# Patient Record
Sex: Female | Born: 1965
Health system: Southern US, Community
[De-identification: ages and names within clinical notes are randomized; demographics above are authoritative.]

## PROBLEM LIST (undated history)

## (undated) DIAGNOSIS — I10 Essential (primary) hypertension: Secondary | ICD-10-CM

## (undated) DIAGNOSIS — T7840XA Allergy, unspecified, initial encounter: Secondary | ICD-10-CM

## (undated) DIAGNOSIS — F419 Anxiety disorder, unspecified: Secondary | ICD-10-CM

## (undated) DIAGNOSIS — K5790 Diverticulosis of intestine, part unspecified, without perforation or abscess without bleeding: Secondary | ICD-10-CM

## (undated) DIAGNOSIS — E079 Disorder of thyroid, unspecified: Secondary | ICD-10-CM

## (undated) DIAGNOSIS — K219 Gastro-esophageal reflux disease without esophagitis: Secondary | ICD-10-CM

## (undated) DIAGNOSIS — F32A Depression, unspecified: Secondary | ICD-10-CM

## (undated) DIAGNOSIS — F329 Major depressive disorder, single episode, unspecified: Secondary | ICD-10-CM

## (undated) HISTORY — DX: Gastro-esophageal reflux disease without esophagitis: K21.9

## (undated) HISTORY — DX: Disorder of thyroid, unspecified: E07.9

## (undated) HISTORY — PX: ARTHROSCOPIC REPAIR ACL: SUR80

## (undated) HISTORY — DX: Essential (primary) hypertension: I10

## (undated) HISTORY — DX: Depression, unspecified: F32.A

## (undated) HISTORY — PX: WISDOM TOOTH EXTRACTION: SHX21

## (undated) HISTORY — DX: Allergy, unspecified, initial encounter: T78.40XA

## (undated) HISTORY — DX: Anxiety disorder, unspecified: F41.9

## (undated) HISTORY — DX: Diverticulosis of intestine, part unspecified, without perforation or abscess without bleeding: K57.90

## (undated) HISTORY — DX: Major depressive disorder, single episode, unspecified: F32.9

---

## 2012-03-17 DIAGNOSIS — G4700 Insomnia, unspecified: Secondary | ICD-10-CM | POA: Insufficient documentation

## 2013-01-01 DIAGNOSIS — R7301 Impaired fasting glucose: Secondary | ICD-10-CM | POA: Insufficient documentation

## 2015-05-11 DIAGNOSIS — E785 Hyperlipidemia, unspecified: Secondary | ICD-10-CM | POA: Insufficient documentation

## 2016-05-10 DIAGNOSIS — D1720 Benign lipomatous neoplasm of skin and subcutaneous tissue of unspecified limb: Secondary | ICD-10-CM | POA: Insufficient documentation

## 2018-01-09 DIAGNOSIS — K760 Fatty (change of) liver, not elsewhere classified: Secondary | ICD-10-CM | POA: Insufficient documentation

## 2018-07-23 MED FILL — FLUTICASONE PROP 50 MCG SPR: 50 | 60 days supply | Qty: 16 | Fill #0

## 2018-07-23 MED FILL — ATORVASTATIN CALCIUM 20 MG: 20 | 90 days supply | Qty: 90 | Fill #0

## 2018-07-23 MED FILL — BISOPROLOL-HCTZ 2.5-6.25 MG: 2.5-6.25 | 90 days supply | Qty: 90 | Fill #0

## 2018-07-23 MED FILL — LIOTHYRONINE SODIUM 5 MCG T: 5 | 90 days supply | Qty: 90 | Fill #0

## 2018-07-23 MED FILL — FLUoxetine HCL 20 MG CAPS: 20 | 90 days supply | Qty: 270 | Fill #0

## 2018-11-06 MED FILL — FLUoxetine HCL 20 MG CAPS: 20 | 30 days supply | Qty: 90 | Fill #0

## 2018-11-11 ENCOUNTER — Ambulatory Visit (INDEPENDENT_AMBULATORY_CARE_PROVIDER_SITE_OTHER): Payer: 59 | Admitting: Family Medicine

## 2018-11-11 ENCOUNTER — Encounter: Payer: Self-pay | Admitting: Family Medicine

## 2018-11-11 VITALS — BP 126/79 | HR 64 | Temp 98.8°F | Ht 61.0 in | Wt 194.2 lb

## 2018-11-11 DIAGNOSIS — E039 Hypothyroidism, unspecified: Secondary | ICD-10-CM | POA: Diagnosis not present

## 2018-11-11 DIAGNOSIS — F419 Anxiety disorder, unspecified: Secondary | ICD-10-CM | POA: Diagnosis not present

## 2018-11-11 DIAGNOSIS — Z23 Encounter for immunization: Secondary | ICD-10-CM | POA: Diagnosis not present

## 2018-11-11 DIAGNOSIS — H9190 Unspecified hearing loss, unspecified ear: Secondary | ICD-10-CM

## 2018-11-11 DIAGNOSIS — G4733 Obstructive sleep apnea (adult) (pediatric): Secondary | ICD-10-CM

## 2018-11-11 DIAGNOSIS — K219 Gastro-esophageal reflux disease without esophagitis: Secondary | ICD-10-CM

## 2018-11-11 DIAGNOSIS — F325 Major depressive disorder, single episode, in full remission: Secondary | ICD-10-CM | POA: Diagnosis not present

## 2018-11-11 DIAGNOSIS — E785 Hyperlipidemia, unspecified: Secondary | ICD-10-CM | POA: Diagnosis not present

## 2018-11-11 DIAGNOSIS — J309 Allergic rhinitis, unspecified: Secondary | ICD-10-CM | POA: Diagnosis not present

## 2018-11-11 DIAGNOSIS — Z6836 Body mass index (BMI) 36.0-36.9, adult: Secondary | ICD-10-CM

## 2018-11-11 DIAGNOSIS — I10 Essential (primary) hypertension: Secondary | ICD-10-CM | POA: Diagnosis not present

## 2018-11-11 MED ORDER — ATORVASTATIN CALCIUM 20 MG PO TABS: 20.0000 mg | ORAL_TABLET | Freq: Every day | ORAL | 3 refills | Status: DC

## 2018-11-11 MED ORDER — FLUTICASONE PROPIONATE 50 MCG/ACT NA SUSP: 2.0000 | Freq: Every day | NASAL | 3 refills | Status: DC

## 2018-11-11 MED ORDER — FLUOXETINE HCL 20 MG PO CAPS: 20.0000 mg | ORAL_CAPSULE | Freq: Every day | ORAL | 3 refills | Status: DC

## 2018-11-11 MED ORDER — LIOTHYRONINE SODIUM 5 MCG PO TABS: 5.0000 ug | ORAL_TABLET | Freq: Every day | ORAL | 3 refills | Status: DC

## 2018-11-11 MED ORDER — OMEPRAZOLE 40 MG PO CPDR: 40.0000 mg | DELAYED_RELEASE_CAPSULE | Freq: Every day | ORAL | 3 refills | Status: DC

## 2018-11-11 MED FILL — OMEPRAZOLE 40 MG CPDR: 40 | 90 days supply | Qty: 90 | Fill #0

## 2018-11-11 MED FILL — LIOTHYRONINE SODIUM 5 MCG T: 5 | 90 days supply | Qty: 90 | Fill #0

## 2018-11-11 MED FILL — ATORVASTATIN CALCIUM 20 MG: 20 | 90 days supply | Qty: 90 | Fill #0

## 2018-11-11 MED FILL — FLUTICASONE PROP 50 MCG SPR: 50 | 30 days supply | Qty: 16 | Fill #0

## 2018-11-11 NOTE — Assessment & Plan Note (Signed)
Continue liothyronine 5 mcg daily.  Obtain records from previous PCP regarding most recent thyroid labs.

## 2018-11-11 NOTE — Progress Notes (Signed)
Chief Complaint:  Rachael Green is a 53 y.o. female who presents today with a chief complaint of fatigue and to establish care.   Assessment/Plan:  Fatigue Likely multifactorial.  It is possible that she has underlying untreated sleep apnea.  We will stop her beta-blocker today which will hopefully help some.  Will obtain records from previous PCP regarding most recent blood work.  Will need to have repeat CBC, CMAC, and B12 if not done recently.  If above is negative and continues to have issues, will readdress possible underlying sleep apnea.  Essential hypertension Well-controlled.  We will stop her current medication given that she is typically well controlled and her beta-blocker could potentially be contributing to her fatigue.  Discussed close home blood pressure monitoring with goal 140/90 or lower.  Discussed importance of regular activity and low-salt diet.  Follow-up with me in 6 months.  Hypothyroidism Continue liothyronine 5 mcg daily.  Obtain records from previous PCP regarding most recent thyroid labs.  Depression, major, in remission ([name]) Stable.  Continue Prozac 20 mg daily.  Anxiety Stable.  Continue Prozac 20 mg daily.  GERD (gastroesophageal reflux disease) Stable.  Continue Prilosec 40 mg daily as needed.  Check B12 with next blood draw.  Dyslipidemia Continue atorvastatin 20 mg daily.  Obtain records from previous PCP regarding most recent lipid panel.  Allergic rhinitis Stable.  Continue Flonase.  Hearing loss No red flags.  Likely presbycusis.  Discussed possible referral to ENT/audiology however patient deferred for the time being.  Discussed importance of hearing protection.  OSA (obstructive sleep apnea) Diagnosed in [place].  Not on CPAP.  Preventative Healthcare Patient was instructed to return soon for CPE.  Flu shot given today.  Obtain records from recent PCP regarding most recent mammogram, colonoscopy, and Pap smear. Health Maintenance Due    Topic Date Due  . HIV Screening  09[birth year]1983  . INFLUENZA VACCINE  04/30/2018  . MAMMOGRAM  09/28/2018  . COLONOSCOPY  09/28/2018     Subjective:  HPI:  Fatigue She has had issues with fatigue for several years.  Symptoms are worsened over the last few weeks.  She has had a sleep study in the past and was reportedly diagnosed with OSA.  Patient never started CPAP therapy.  She has no clear precipitating event for worsening fatigue the last few weeks.  She has been more active.  She feels okay in the morning however becomes severely tired early in the afternoon.  Hearing loss Patient has also noticed worsening hearing over the last several years.  Noticed that some sounds become more muffled.  Has not seen a physician for this.  No vertigo.  No weakness or numbness.  Her chronic medical conditions are outlined below:  # Essential Hypertension - On bisoprolol 2.5-6.25 once daily. Tolerating well.  - ROS: No reported chest pain or shortness of breath  # Hypothyroidism - On liothyronine 3mcg daily and tolerating well. - ROS: No reported hot or cold interolance.   # Anxiety / Depression - On prozac 20mg  daily and doing well - ROS: No SI or HI  # GERD  - On prilosec 40mg  daily as needed and tolerating well  # Dyslipidemia - On lipitor 20mg  daily and tolerating well  # Allergic Rhinitis - Uses flonase as needed and doing well  Depression screen [facility] 2/9 11/11/2018  Decreased Interest 0  Down, Depressed, Hopeless 0  PHQ - 2 Score 0  Altered sleeping 3  Tired, decreased energy 2  Change  in appetite 0  Feeling bad or failure about yourself  0  Trouble concentrating 0  Moving slowly or fidgety/restless 0  Suicidal thoughts 0  PHQ-9 Score 5  Difficult doing work/chores Not difficult at all    GAD 7 : Generalized Anxiety Score 11/11/2018  Nervous, Anxious, on Edge 2  Control/stop worrying 0  Worry too much - different things 2  Trouble relaxing 2  Restless 2  Easily  annoyed or irritable 0  Afraid - awful might happen 0  Total GAD 7 Score 8  Anxiety Difficulty Not difficult at all   ROS: Per HPI, otherwise a complete review of systems was negative.   PMH:  The following were reviewed and entered/updated in epic: Past Medical History:  Diagnosis Date  . Allergy   . Anxiety   . Depression   . Diverticulosis   . GERD (gastroesophageal reflux disease)   . Hypertension   . Thyroid disease    Patient Active Problem List   Diagnosis Date Noted  . Essential hypertension 11/11/2018  . Hypothyroidism 11/11/2018  . Depression, major, in remission ([place]) 11/11/2018  . Anxiety 11/11/2018  . GERD (gastroesophageal reflux disease) 11/11/2018  . Dyslipidemia 11/11/2018  . Allergic rhinitis 11/11/2018  . Hearing loss 11/11/2018  . OSA (obstructive sleep apnea) 11/11/2018   Past Surgical History:  Procedure Laterality Date  . ARTHROSCOPIC REPAIR ACL     left  . WISDOM TOOTH EXTRACTION      Family History  Problem Relation Age of Onset  . Depression Mother   . Kidney cancer Mother   . Leukemia Maternal Uncle     Medications- reviewed and updated Current Outpatient Medications  Medication Sig Dispense Refill  . atorvastatin (LIPITOR) 20 MG tablet Take 1 tablet (20 mg total) by mouth daily at 6 PM. 90 tablet 3  . FLUoxetine (PROZAC) 20 MG capsule Take 1 capsule (20 mg total) by mouth daily. 90 capsule 3  . fluticasone (FLONASE) 50 MCG/ACT nasal spray Place 2 sprays into both nostrils daily. 16 g 3  . liothyronine (CYTOMEL) 5 MCG tablet Take 1 tablet (5 mcg total) by mouth daily. 90 tablet 3  . omeprazole (PRILOSEC) 40 MG capsule Take 1 capsule (40 mg total) by mouth daily. 90 capsule 3   No current facility-administered medications for this visit.     Allergies-reviewed and updated No Known Allergies  Social History   Socioeconomic History  . Marital status: Married    Spouse name: Not on file  . Number of children: Not on file  .  Years of education: Not on file  . Highest education level: Not on file  Occupational History  . Not on file  Social Needs  . Financial resource strain: Not on file  . Food insecurity:    Worry: Not on file    Inability: Not on file  . Transportation needs:    Medical: Not on file    Non-medical: Not on file  Tobacco Use  . Smoking status: Never Smoker  . Smokeless tobacco: Never Used  Substance and Sexual Activity  . Alcohol use: Yes    Alcohol/week: 2.0 standard drinks    Types: 2 Glasses of wine per week  . Drug use: Never  . Sexual activity: Yes  Lifestyle  . Physical activity:    Days per week: Not on file    Minutes per session: Not on file  . Stress: Not on file  Relationships  . Social connections:    Talks  on phone: Not on file    Gets together: Not on file    Attends religious service: Not on file    Active member of club or organization: Not on file    Attends meetings of clubs or organizations: Not on file    Relationship status: Not on file  Other Topics Concern  . Not on file  Social History Narrative  . Not on file         Objective:  Physical Exam: BP 126/79 (BP Location: Left Arm, Patient Position: Sitting, Cuff Size: Normal)   Pulse 64   Temp 98.8 F (37.1 C) (Oral)   Ht 5\' 1"  (1.549 m)   Wt 194 lb 3.2 oz (88.1 kg)   LMP 07/11/2018   BMI 36.69 kg/m   Gen: NAD, resting comfortably HEENT: EAC and TMs normal bilaterally. CV: Regular rate and rhythm with no murmurs appreciated Pulm: Normal work of breathing, clear to auscultation bilaterally with no crackles, wheezes, or rhonchi GI: Normal bowel sounds present. Soft, Nontender, Nondistended. MSK: No edema, cyanosis, or clubbing noted Skin: Warm, dry Neuro: Grossly normal, moves all extremities Psych: Normal affect and thought content     Caleb M. Jerline Pain, MD 11/11/2018 12:00 PM

## 2018-11-11 NOTE — Assessment & Plan Note (Signed)
Continue atorvastatin 20 mg daily.  Obtain records from previous PCP regarding most recent lipid panel.

## 2018-11-11 NOTE — Assessment & Plan Note (Signed)
Stable.  Continue Prozac 20 mg daily. 

## 2018-11-11 NOTE — Assessment & Plan Note (Signed)
No red flags.  Likely presbycusis.  Discussed possible referral to ENT/audiology however patient deferred for the time being.  Discussed importance of hearing protection.

## 2018-11-11 NOTE — Assessment & Plan Note (Signed)
Stable. Continue Flonase 

## 2018-11-11 NOTE — Assessment & Plan Note (Signed)
Diagnosed in [place].  Not on CPAP.

## 2018-11-11 NOTE — Assessment & Plan Note (Signed)
Well-controlled.  We will stop her current medication given that she is typically well controlled and her beta-blocker could potentially be contributing to her fatigue.  Discussed close home blood pressure monitoring with goal 140/90 or lower.  Discussed importance of regular activity and low-salt diet.  Follow-up with me in 6 months.

## 2018-11-11 NOTE — Assessment & Plan Note (Signed)
Stable.  Continue Prilosec 40 mg daily as needed.  Check B12 with next blood draw.

## 2018-11-11 NOTE — Patient Instructions (Addendum)
It was very nice to see you today!  Please stop your blood pressure medications.  Please keep an eye on your blood pressure and let me know if persistently 140/90 or higher.  Please let me know if your fatigue levels do not improve.  I will refill your other medications today.   Come back to see me in 6 months for your physical with blood work, or sooner as needed.  Take care, Dr Jerline Pain

## 2018-12-01 ENCOUNTER — Other Ambulatory Visit: Payer: Self-pay | Admitting: Family Medicine

## 2018-12-01 MED ORDER — FLUOXETINE HCL 20 MG PO CAPS: 20.0000 mg | ORAL_CAPSULE | Freq: Every day | ORAL | 0 refills | Status: DC

## 2018-12-01 MED ORDER — ATORVASTATIN CALCIUM 20 MG PO TABS: 20.0000 mg | ORAL_TABLET | Freq: Every day | ORAL | 0 refills | Status: DC

## 2018-12-01 MED ORDER — LIOTHYRONINE SODIUM 5 MCG PO TABS: 5.0000 ug | ORAL_TABLET | Freq: Every day | ORAL | 0 refills | Status: DC

## 2018-12-01 NOTE — Telephone Encounter (Signed)
Copied from [place] [id number]. Topic: Quick Communication - Rx Refill/Question >> Dec 01, 2018  1:17 PM Waylan Rocher, Lumin L wrote: Medication: atorvastatin (LIPITOR) 20 MG tablet, FLUoxetine (PROZAC) 20 MG capsule, liothyronine (CYTOMEL) 5 MCG tablet   (out of scripts as of tomorrow, needs 8 days supply as [place])  Has the patient contacted their pharmacy? Yes.   (Agent: If no, request that the patient contact the pharmacy for the refill.) (Agent: If yes, when and what did the pharmacy advise?) she [place] of state   Preferred Pharmacy (with phone number or street name):  [id number] - [place], [address]  [place] [address]  Phone: [phone] Fax: [phone]   Agent: Please be advised that RX refills may take up to 3 business days. We ask that you follow-up with your pharmacy.  Patient would like a call once filled.

## 2018-12-01 NOTE — Telephone Encounter (Signed)
Requested Prescriptions  Pending Prescriptions Disp Refills  . atorvastatin (LIPITOR) 20 MG tablet 10 tablet 0    Sig: Take 1 tablet (20 mg total) by mouth daily at 6 PM.     Cardiovascular:  Antilipid - Statins Failed - 12/01/2018  1:58 PM      Failed - Total Cholesterol in normal range and within 360 days    No results found for: CHOL, POCCHOL       Failed - LDL in normal range and within 360 days    No results found for: LDLCALC, LDLC, HIRISKLDL       Failed - HDL in normal range and within 360 days    No results found for: HDL       Failed - Triglycerides in normal range and within 360 days    No results found for: TRIG       Passed - Patient is not pregnant      Passed - Valid encounter within last 12 months    Recent Outpatient Visits          2 weeks ago Need for immunization against influenza   [place] [facility], Algis Greenhouse, MD      Future Appointments            In 3 months Jerline Pain, Algis Greenhouse, MD [place], [place]         . liothyronine (CYTOMEL) 5 MCG tablet 10 tablet 0    Sig: Take 1 tablet (5 mcg total) by mouth daily.     Endocrinology:  Hypothyroid Agents Failed - 12/01/2018  1:58 PM      Failed - TSH needs to be rechecked within 3 months after an abnormal result. Refill until TSH is due.      Failed - TSH in normal range and within 360 days    No results found for: TSH       Passed - Valid encounter within last 12 months    Recent Outpatient Visits          2 weeks ago Need for immunization against influenza   [place] [facility], Algis Greenhouse, MD      Future Appointments            In 3 months Jerline Pain, Algis Greenhouse, MD [place], [facility]         . FLUoxetine (PROZAC) 20 MG capsule 10 capsule 0    Sig: Take 1 capsule (20 mg total) by mouth daily.     Psychiatry:  Antidepressants - SSRI Passed - 12/01/2018  1:58 PM      Passed - Completed PHQ-2 or PHQ-9 in the last 360  days.      Passed - Valid encounter within last 6 months    Recent Outpatient Visits          2 weeks ago Need for immunization against influenza   [place] [facility], Algis Greenhouse, MD      Future Appointments            In 3 months Jerline Pain, Algis Greenhouse, MD [place], [place]

## 2018-12-18 MED FILL — FLUoxetine HCL 20 MG CAPS: 20 | 90 days supply | Qty: 90 | Fill #0

## 2019-01-14 ENCOUNTER — Telehealth: Payer: Self-pay | Admitting: Family Medicine

## 2019-01-14 NOTE — Telephone Encounter (Signed)
See Rx refill request.  

## 2019-01-14 NOTE — Telephone Encounter (Signed)
Left voice message for patient to call clinic.  

## 2019-01-14 NOTE — Telephone Encounter (Signed)
Copied from [place] [id number]. Topic: Quick Communication - Rx Refill/Question >> Jan 14, 2019 10:20 AM Celene Kras A wrote: Medication: FLUoxetine (PROZAC) 20 MG capsule  Has the patient contacted their pharmacy? Yes.  Pt states she is almost out of this medication because she is used to taking 3 capsules a day, but pt states the pharmacy told her a refill was only sent in enough for 1 capsule a day. Please advise.   (Agent: If no, request that the patient contact the pharmacy for the refill.) (Agent: If yes, when and what did the pharmacy advise?)  Preferred Pharmacy (with phone number or street name): [id number] - [place], [place] [address] Phone: [phone] Fax: [phone] Not a 24 hour pharmacy; exact hours not known.    Agent: Please be advised that RX refills may take up to 3 business days. We ask that you follow-up with your pharmacy.

## 2019-01-14 NOTE — Telephone Encounter (Signed)
Ok to send in 60mg  daily.  Algis Greenhouse. Jerline Pain, MD 01/14/2019 12:15 PM

## 2019-01-14 NOTE — Telephone Encounter (Signed)
See note

## 2019-01-14 NOTE — Telephone Encounter (Signed)
Patient called and states that her prescription for FLUoxetine (PROZAC) 20 MG capsule is incorrect. She states she takes 3 tablets a day. She wants this resent into her pharmacy corrected since she is almost out. Please advise it seems patient has only had one appointment with Dr. Jerline Pain.

## 2019-01-15 ENCOUNTER — Other Ambulatory Visit: Payer: Self-pay

## 2019-01-15 MED ORDER — FLUOXETINE HCL 20 MG PO CAPS: 60.0000 mg | ORAL_CAPSULE | Freq: Every day | ORAL | 1 refills | Status: DC

## 2019-01-15 NOTE — Telephone Encounter (Signed)
done

## 2019-01-15 NOTE — Telephone Encounter (Signed)
Spoke with patient notified we will send Rx.

## 2019-01-22 MED FILL — FLUoxetine HCL 20 MG CAPS: 20 | 30 days supply | Qty: 90 | Fill #0

## 2019-03-08 MED FILL — ATORVASTATIN 20 MG TABLET: 20 | 90 days supply | Qty: 90 | Fill #1

## 2019-03-10 ENCOUNTER — Ambulatory Visit (INDEPENDENT_AMBULATORY_CARE_PROVIDER_SITE_OTHER): Payer: 59 | Admitting: Family Medicine

## 2019-03-10 ENCOUNTER — Other Ambulatory Visit: Payer: Self-pay

## 2019-03-10 ENCOUNTER — Encounter: Payer: Self-pay | Admitting: Family Medicine

## 2019-03-10 VITALS — BP 118/84 | HR 72 | Temp 98.9°F | Ht 61.0 in | Wt 195.2 lb

## 2019-03-10 DIAGNOSIS — F325 Major depressive disorder, single episode, in full remission: Secondary | ICD-10-CM | POA: Diagnosis not present

## 2019-03-10 DIAGNOSIS — I8393 Asymptomatic varicose veins of bilateral lower extremities: Secondary | ICD-10-CM

## 2019-03-10 DIAGNOSIS — K219 Gastro-esophageal reflux disease without esophagitis: Secondary | ICD-10-CM | POA: Diagnosis not present

## 2019-03-10 DIAGNOSIS — E785 Hyperlipidemia, unspecified: Secondary | ICD-10-CM

## 2019-03-10 DIAGNOSIS — Z6836 Body mass index (BMI) 36.0-36.9, adult: Secondary | ICD-10-CM | POA: Diagnosis not present

## 2019-03-10 DIAGNOSIS — J309 Allergic rhinitis, unspecified: Secondary | ICD-10-CM | POA: Diagnosis not present

## 2019-03-10 DIAGNOSIS — F419 Anxiety disorder, unspecified: Secondary | ICD-10-CM

## 2019-03-10 DIAGNOSIS — G4733 Obstructive sleep apnea (adult) (pediatric): Secondary | ICD-10-CM

## 2019-03-10 DIAGNOSIS — E039 Hypothyroidism, unspecified: Secondary | ICD-10-CM | POA: Diagnosis not present

## 2019-03-10 DIAGNOSIS — I10 Essential (primary) hypertension: Secondary | ICD-10-CM | POA: Diagnosis not present

## 2019-03-10 DIAGNOSIS — R5383 Other fatigue: Secondary | ICD-10-CM | POA: Diagnosis not present

## 2019-03-10 DIAGNOSIS — Z0001 Encounter for general adult medical examination with abnormal findings: Secondary | ICD-10-CM | POA: Diagnosis not present

## 2019-03-10 DIAGNOSIS — M25562 Pain in left knee: Secondary | ICD-10-CM

## 2019-03-10 LAB — COMPREHENSIVE METABOLIC PANEL
ALT: 56 U/L — ABNORMAL HIGH (ref 0–35)
AST: 40 U/L — ABNORMAL HIGH (ref 0–37)
Albumin: 4.3 g/dL (ref 3.5–5.2)
Alkaline Phosphatase: 95 U/L (ref 39–117)
BUN: 11 mg/dL (ref 6–23)
CO2: 30 mEq/L (ref 19–32)
Calcium: 9.4 mg/dL (ref 8.4–10.5)
Chloride: 102 mEq/L (ref 96–112)
Creatinine, Ser: 0.53 mg/dL (ref 0.40–1.20)
GFR: 121.89 mL/min (ref >60.00)
Glucose, Bld: 85 mg/dL (ref 70–99)
Potassium: 4.6 mEq/L (ref 3.5–5.1)
Sodium: 140 mEq/L (ref 135–145)
Total Bilirubin: 0.4 mg/dL (ref 0.2–1.2)
Total Protein: 6.7 g/dL (ref 6.0–8.3)

## 2019-03-10 LAB — CBC
HCT: 40.1 % (ref 36.0–46.0)
Hemoglobin: 13.8 g/dL (ref 12.0–15.0)
MCHC: 34.3 g/dL (ref 30.0–36.0)
MCV: 94.1 fl (ref 78.0–100.0)
Platelets: 291 10*3/uL (ref 150.0–400.0)
RBC: 4.26 Mil/uL (ref 3.87–5.11)
RDW: 13.2 % (ref 11.5–15.5)
WBC: 6.1 10*3/uL (ref 4.0–10.5)

## 2019-03-10 LAB — LIPID PANEL
Cholesterol: 164 mg/dL (ref 0–200)
HDL: 63.4 mg/dL (ref >39.00)
LDL Cholesterol: 76 mg/dL (ref 0–99)
NonHDL: 100.15
Total CHOL/HDL Ratio: 3
Triglycerides: 119 mg/dL (ref 0.0–149.0)
VLDL: 23.8 mg/dL (ref 0.0–40.0)

## 2019-03-10 LAB — VITAMIN B12: Vitamin B-12: 383 pg/mL (ref 211–911)

## 2019-03-10 LAB — HEMOGLOBIN A1C: Hgb A1c MFr Bld: 5.8 % (ref 4.6–6.5)

## 2019-03-10 LAB — VITAMIN D 25 HYDROXY (VIT D DEFICIENCY, FRACTURES): VITD: 33.48 ng/mL (ref 30.00–100.00)

## 2019-03-10 LAB — TSH: TSH: 1.97 u[IU]/mL (ref 0.35–4.50)

## 2019-03-10 MED ORDER — FLUTICASONE PROPIONATE 50 MCG/ACT NA SUSP: 2.0000 | Freq: Every day | NASAL | 3 refills | Status: DC

## 2019-03-10 MED ORDER — LIOTHYRONINE SODIUM 5 MCG PO TABS: 5.0000 ug | ORAL_TABLET | Freq: Every day | ORAL | 3 refills | Status: DC

## 2019-03-10 MED ORDER — ATORVASTATIN CALCIUM 20 MG PO TABS: 20.0000 mg | ORAL_TABLET | Freq: Every day | ORAL | 3 refills | Status: DC

## 2019-03-10 MED FILL — FLUTICASONE PROP 50 MCG SPR: 50 | 30 days supply | Qty: 16 | Fill #0

## 2019-03-10 MED FILL — LIOTHYRONINE SODIUM 5 MCG T: 5 | 90 days supply | Qty: 90 | Fill #0

## 2019-03-10 NOTE — Assessment & Plan Note (Signed)
Continue omeprazole as needed.

## 2019-03-10 NOTE — Progress Notes (Signed)
Chief Complaint:  Rachael Green is a 53 y.o. female who presents today for her annual comprehensive physical exam.    Assessment/Plan:  Hypothyroidism Continue current dose of Cytomel.  Check TSH.  GERD (gastroesophageal reflux disease) Continue omeprazole as needed.  Essential hypertension Stable off medications.  Dyslipidemia Check lipid panel.  Continue Lipitor 20 mg daily.  Depression, major, in remission ([place]) Stable off meds.  Continue with watchful waiting.  Anxiety Stable off meds.  Continue with watchful waiting.  Allergic rhinitis Stable.  Continue Flonase as needed seasonally.  OSA (obstructive sleep apnea) Could be contributing to her fatigue.  Will check labs as noted above.  May need referral for another sleep study if no significant findings.  Knee Pain Normal exam.  History consistent with possible Nischal tear.  Given symptoms are not prickly bothersome at this point, will continue with conservative management including compression, ice, and anti-inflammatories as needed.  If no improvement or symptoms worsen will need referral to sports medicine or orthopedics.  Varicose veins No red flags.  Reassured patient.  Fatigue / Insomnia Likely multifactorial.  She has sleep apnea that she is not currently being treated for.  This is likely the main driver.  We will also check CBC, C met, A1c, TSH, B12, and vitamin D.  Consider trial of sleep aid if above work-up is negative.  BMI 36 Discussed lifestyle modifications.   Preventative Healthcare: Advised patient to get mammogram soon.  Otherwise up-to-date on Pap and colon cancer screening.   Patient Counseling(The following topics were reviewed and/or handout was given):  -Nutrition: Stressed importance of moderation in sodium/caffeine intake, saturated fat and cholesterol, caloric balance, sufficient intake of fresh fruits, vegetables, and fiber.  -Stressed the importance of regular exercise.   -Substance  Abuse: Discussed cessation/primary prevention of tobacco, alcohol, or other drug use; driving or other dangerous activities under the influence; availability of treatment for abuse.   -Injury prevention: Discussed safety belts, safety helmets, smoke detector, smoking near bedding or upholstery.   -Sexuality: Discussed sexually transmitted diseases, partner selection, use of condoms, avoidance of unintended pregnancy and contraceptive alternatives.   -Dental health: Discussed importance of regular tooth brushing, flossing, and dental visits.  -Health maintenance and immunizations reviewed. Please refer to Health maintenance section.  Return to care in 1 year for next preventative visit.     Subjective:  HPI:  She has no acute complaints today.   She has had some difficulty with fatigue over the past several months.  Symptoms seem to be stable.  She has been tested for sleep apnea and was recommended to have a CPAP machine however she did not want to wear this.  Sleeps about 6 hours per night.  Does not think that she snores.  Frequently naps during the day.  She also has had some left knee pain that started about 2 months ago.  Started after having her electronic bike fall onto her knee.  Occasionally has buckling.  Symptoms to be worse first thing in the morning and gradually improves throughout the day.  No other treatments tried.  Symptoms seem to be improving.  Her stable, chronic medical conditions are outlined below:  # Essential Hypertension - Not currently on any medications - ROS: No reported chest pain or shortness of breath  # Hypothyroidism - On liothyronine 74mg daily and tolerating well. - ROS: No reported hot or cold interolance.   # Anxiety / Depression - Not currently on any medications - ROS: No SI or  HI  # GERD  - On prilosec 40mg daily as needed and tolerating well  # Dyslipidemia - On lipitor 20mg daily and tolerating well  # Allergic Rhinitis - Uses flonase  as needed and doing well  Lifestyle Diet: No specific diets or eating plans. Tries to eat a healthy diet overall.  Exercise: Was going to the gym, but has not been able to lately due to COVID.   Depression screen PHQ 2/9 03/10/2019  Decreased Interest 0  Down, Depressed, Hopeless 0  PHQ - 2 Score 0  Altered sleeping 3  Tired, decreased energy 0  Change in appetite 0  Feeling bad or failure about yourself  0  Trouble concentrating 0  Moving slowly or fidgety/restless 0  Suicidal thoughts 0  PHQ-9 Score 3  Difficult doing work/chores -    Health Maintenance Due  Topic Date Due  . HIV Screening  09[birth year]29/1981    ROS: Per HPI, otherwise a complete review of systems was negative.   PMH:  The following were reviewed and entered/updated in epic: Past Medical History:  Diagnosis Date  . Allergy   . Anxiety   . Depression   . Diverticulosis   . GERD (gastroesophageal reflux disease)   . Hypertension   . Thyroid disease    Patient Active Problem List   Diagnosis Date Noted  . Essential hypertension 11/11/2018  . Hypothyroidism 11/11/2018  . Depression, major, in remission ([facility]) 11/11/2018  . Anxiety 11/11/2018  . GERD (gastroesophageal reflux disease) 11/11/2018  . Dyslipidemia 11/11/2018  . Allergic rhinitis 11/11/2018  . Hearing loss 11/11/2018  . OSA (obstructive sleep apnea) 11/11/2018   Past Surgical History:  Procedure Laterality Date  . ARTHROSCOPIC REPAIR ACL     left  . WISDOM TOOTH EXTRACTION     Family History  Problem Relation Age of Onset  . Depression Mother   . Kidney cancer Mother   . Leukemia Maternal Uncle     Medications- reviewed and updated Current Outpatient Medications  Medication Sig Dispense Refill  . atorvastatin (LIPITOR) 20 MG tablet Take 1 tablet (20 mg total) by mouth daily at 6 PM. 90 tablet 3  . fluticasone (FLONASE) 50 MCG/ACT nasal spray Place 2 sprays into both nostrils daily. 16 g 3  . liothyronine (CYTOMEL) 5 MCG tablet Take  1 tablet (5 mcg total) by mouth daily. 90 tablet 3  . omeprazole (PRILOSEC) 40 MG capsule Take 1 capsule (40 mg total) by mouth daily. 90 capsule 3   No current facility-administered medications for this visit.     Allergies-reviewed and updated No Known Allergies  Social History   Socioeconomic History  . Marital status: Married    Spouse name: Not on file  . Number of children: Not on file  . Years of education: Not on file  . Highest education level: Not on file  Occupational History  . Not on file  Social Needs  . Financial resource strain: Not on file  . Food insecurity:    Worry: Not on file    Inability: Not on file  . Transportation needs:    Medical: Not on file    Non-medical: Not on file  Tobacco Use  . Smoking status: Never Smoker  . Smokeless tobacco: Never Used  Substance and Sexual Activity  . Alcohol use: Yes    Alcohol/week: 2.0 standard drinks    Types: 2 Glasses of wine per week  . Drug use: Never  . Sexual activity: Yes  Lifestyle  .   Physical activity:    Days per week: Not on file    Minutes per session: Not on file  . Stress: Not on file  Relationships  . Social connections:    Talks on phone: Not on file    Gets together: Not on file    Attends religious service: Not on file    Active member of club or organization: Not on file    Attends meetings of clubs or organizations: Not on file    Relationship status: Not on file  Other Topics Concern  . Not on file  Social History Narrative  . Not on file        Objective:  Physical Exam: BP 118/84 (BP Location: Left Arm, Patient Position: Sitting, Cuff Size: Normal)   Pulse 72   Temp 98.9 F (37.2 C) (Oral)   Ht 5' 1" (1.549 m)   Wt 195 lb 4 oz (88.6 kg)   SpO2 98%   BMI 36.89 kg/m   Body mass index is 36.89 kg/m. Wt Readings from Last 3 Encounters:  03/10/19 195 lb 4 oz (88.6 kg)  11/11/18 194 lb 3.2 oz (88.1 kg)   Gen: NAD, resting comfortably HEENT: TMs normal bilaterally.  OP clear. No thyromegaly noted.  CV: RRR with no murmurs appreciated Pulm: NWOB, CTAB with no crackles, wheezes, or rhonchi Breast: No deformities, lumps, or masses.  Chaperone present for exam. GI: Normal bowel sounds present. Soft, Nontender, Nondistended. MSK: no edema, cyanosis, or clubbing noted.  Left knee mildly tender to patient along joint line.  Full range of motion.  Mild crepitus with active range of motion.  Bilateral lower extremity varicose veins noted. Skin: warm, dry Neuro: CN2-12 grossly intact. Strength 5/5 in upper and lower extremities. Reflexes symmetric and intact bilaterally.  Psych: Normal affect and thought content     Ula Couvillon M. Jerline Pain, MD 03/10/2019 10:28 AM

## 2019-03-10 NOTE — Patient Instructions (Addendum)
It was very nice to see you today!  Your physical exam today is normal.  Your breast exam is normal.  Please get your mammogram soon.  We can repeat your Pap smear in a few years.  Please continue the good work with your diet and exercise.  You can schedule an appoint with a nutritionist if you wish.  I think that your knee pain possibly could be due to a torn meniscus.  Please use compression and ice to the area.  Keep your legs elevated and exercise frequently-this will also help with your varicose veins.  We will check blood work today to see for any possible causes for her fatigue.  We may need to try a sleep aid medication depending on the results.   Eat at least 3 REAL meals and 1-2 snacks per day.  Aim for no more than 5 hours between eating.  Eat breakfast within one hour of getting up.    Obtain twice as many fruits/vegetables as protein or carbohydrate foods for both lunch and dinner.   Cut down on sweet beverages. This includes juice, soda, and sweet tea.    Exercise at least 150 minutes every week.   Take care, Dr Jerline Pain   Preventive Care 40-64 Years, Female Preventive care refers to lifestyle choices and visits with your health care provider that can promote health and wellness. What does preventive care include?   A yearly physical exam. This is also called an annual well check.  Dental exams once or twice a year.  Routine eye exams. Ask your health care provider how often you should have your eyes checked.  Personal lifestyle choices, including: ? Daily care of your teeth and gums. ? Regular physical activity. ? Eating a healthy diet. ? Avoiding tobacco and drug use. ? Limiting alcohol use. ? Practicing safe sex. ? Taking low-dose aspirin daily starting at age 58. ? Taking vitamin and mineral supplements as recommended by your health care provider. What happens during an annual well check? The services and screenings done by your health care provider  during your annual well check will depend on your age, overall health, lifestyle risk factors, and family history of disease. Counseling Your health care provider may ask you questions about your:  Alcohol use.  Tobacco use.  Drug use.  Emotional well-being.  Home and relationship well-being.  Sexual activity.  Eating habits.  Work and work Statistician.  Method of birth control.  Menstrual cycle.  Pregnancy history. Screening You may have the following tests or measurements:  Height, weight, and BMI.  Blood pressure.  Lipid and cholesterol levels. These may be checked every 5 years, or more frequently if you are over 94 years old.  Skin check.  Lung cancer screening. You may have this screening every year starting at age 79 if you have a 30-pack-year history of smoking and currently smoke or have quit within the past 15 years.  Colorectal cancer screening. All adults should have this screening starting at age 49 and continuing until age 46. Your health care provider may recommend screening at age 27. You will have tests every 1-10 years, depending on your results and the type of screening test. People at increased risk should start screening at an earlier age. Screening tests may include: ? Guaiac-based fecal occult blood testing. ? Fecal immunochemical test (FIT). ? Stool DNA test. ? Virtual colonoscopy. ? Sigmoidoscopy. During this test, a flexible tube with a tiny camera (sigmoidoscope) is used to examine your rectum  and lower colon. The sigmoidoscope is inserted through your anus into your rectum and lower colon. ? Colonoscopy. During this test, a long, thin, flexible tube with a tiny camera (colonoscope) is used to examine your entire colon and rectum.  Hepatitis C blood test.  Hepatitis B blood test.  Sexually transmitted disease (STD) testing.  Diabetes screening. This is done by checking your blood sugar (glucose) after you have not eaten for a while  (fasting). You may have this done every 1-3 years.  Mammogram. This may be done every 1-2 years. Talk to your health care provider about when you should start having regular mammograms. This may depend on whether you have a family history of breast cancer.  BRCA-related cancer screening. This may be done if you have a family history of breast, ovarian, tubal, or peritoneal cancers.  Pelvic exam and Pap test. This may be done every 3 years starting at age 72. Starting at age 75, this may be done every 5 years if you have a Pap test in combination with an HPV test.  Bone density scan. This is done to screen for osteoporosis. You may have this scan if you are at high risk for osteoporosis. Discuss your test results, treatment options, and if necessary, the need for more tests with your health care provider. Vaccines Your health care provider may recommend certain vaccines, such as:  Influenza vaccine. This is recommended every year.  Tetanus, diphtheria, and acellular pertussis (Tdap, Td) vaccine. You may need a Td booster every 10 years.  Varicella vaccine. You may need this if you have not been vaccinated.  Zoster vaccine. You may need this after age 30.  Measles, mumps, and rubella (MMR) vaccine. You may need at least one dose of MMR if you were born in 1957 or later. You may also need a second dose.  Pneumococcal 13-valent conjugate (PCV13) vaccine. You may need this if you have certain conditions and were not previously vaccinated.  Pneumococcal polysaccharide (PPSV23) vaccine. You may need one or two doses if you smoke cigarettes or if you have certain conditions.  Meningococcal vaccine. You may need this if you have certain conditions.  Hepatitis A vaccine. You may need this if you have certain conditions or if you travel or work in places where you may be exposed to hepatitis A.  Hepatitis B vaccine. You may need this if you have certain conditions or if you travel or work in  places where you may be exposed to hepatitis B.  Haemophilus influenzae type b (Hib) vaccine. You may need this if you have certain conditions. Talk to your health care provider about which screenings and vaccines you need and how often you need them. This information is not intended to replace advice given to you by your health care provider. Make sure you discuss any questions you have with your health care provider. Document Released: 10/13/2015 Document Revised: 11/06/2017 Document Reviewed: 07/18/2015 Elsevier Interactive Patient Education  2019 [employer].

## 2019-03-10 NOTE — Assessment & Plan Note (Signed)
Could be contributing to her fatigue.  Will check labs as noted above.  May need referral for another sleep study if no significant findings.

## 2019-03-10 NOTE — Assessment & Plan Note (Signed)
Continue current dose of Cytomel.  Check TSH.

## 2019-03-10 NOTE — Assessment & Plan Note (Signed)
Stable.  Continue Flonase as needed seasonally.

## 2019-03-10 NOTE — Assessment & Plan Note (Signed)
Stable off medications.  

## 2019-03-10 NOTE — Assessment & Plan Note (Signed)
Check lipid panel.  Continue Lipitor 20 mg daily. 

## 2019-03-10 NOTE — Assessment & Plan Note (Signed)
Stable off meds.  Continue with watchful waiting.

## 2019-03-11 ENCOUNTER — Encounter: Payer: Self-pay | Admitting: Family Medicine

## 2019-03-11 DIAGNOSIS — R739 Hyperglycemia, unspecified: Secondary | ICD-10-CM | POA: Insufficient documentation

## 2019-03-11 NOTE — Progress Notes (Signed)
Please inform patient of the following:  Her liver tests are up just a bit. Recommend that we repeat in 4-6 weeks to make sure they are stable/improving. Most commonly this is cause by being overweight, though certain infections and medications can also cause it.  Her blood sugar is borderline. Do not need to start medications but recommend continuing to work on diet and exercise and we can recheck in a year.  No obvious causes for her fatigue - all of her other labs are normal. Would like to make sure she is getting adequate sleep. We can try sleep aide medication as we discussed or can check for OSA again.  Algis Greenhouse. Jerline Pain, MD 03/11/2019 4:02 PM

## 2019-05-13 ENCOUNTER — Encounter: Payer: 59 | Admitting: Family Medicine

## 2019-07-22 ENCOUNTER — Encounter: Payer: Self-pay | Admitting: Family Medicine

## 2019-07-25 ENCOUNTER — Encounter: Payer: Self-pay | Admitting: Family Medicine

## 2019-07-27 ENCOUNTER — Ambulatory Visit (INDEPENDENT_AMBULATORY_CARE_PROVIDER_SITE_OTHER): Payer: 59

## 2019-07-27 ENCOUNTER — Encounter: Payer: Self-pay | Admitting: Family Medicine

## 2019-07-27 ENCOUNTER — Other Ambulatory Visit: Payer: Self-pay

## 2019-07-27 DIAGNOSIS — Z23 Encounter for immunization: Secondary | ICD-10-CM | POA: Diagnosis not present

## 2019-08-17 MED FILL — FLUTICASONE PROP 50 MCG SPR: 50 | 30 days supply | Qty: 16 | Fill #1

## 2019-08-31 DIAGNOSIS — Z0100 Encounter for examination of eyes and vision without abnormal findings: Secondary | ICD-10-CM | POA: Diagnosis not present

## 2019-11-01 ENCOUNTER — Encounter: Payer: Self-pay | Admitting: Family Medicine

## 2019-11-02 ENCOUNTER — Ambulatory Visit: Payer: No Typology Code available for payment source | Attending: Internal Medicine

## 2019-11-02 DIAGNOSIS — Z20822 Contact with and (suspected) exposure to covid-19: Secondary | ICD-10-CM | POA: Insufficient documentation

## 2019-11-02 MED FILL — FLUTICASONE PROP 50 MCG SPR: 50 | 30 days supply | Qty: 16 | Fill #2

## 2019-11-03 LAB — NOVEL CORONAVIRUS, NAA: SARS-CoV-2, NAA: NOT DETECTED

## 2019-11-04 ENCOUNTER — Ambulatory Visit (INDEPENDENT_AMBULATORY_CARE_PROVIDER_SITE_OTHER): Payer: No Typology Code available for payment source | Admitting: Family Medicine

## 2019-11-04 ENCOUNTER — Encounter: Payer: Self-pay | Admitting: Family Medicine

## 2019-11-04 ENCOUNTER — Other Ambulatory Visit (INDEPENDENT_AMBULATORY_CARE_PROVIDER_SITE_OTHER): Payer: No Typology Code available for payment source

## 2019-11-04 VITALS — BP 132/91 | HR 70

## 2019-11-04 DIAGNOSIS — R42 Dizziness and giddiness: Secondary | ICD-10-CM | POA: Diagnosis not present

## 2019-11-04 DIAGNOSIS — E039 Hypothyroidism, unspecified: Secondary | ICD-10-CM | POA: Diagnosis not present

## 2019-11-04 DIAGNOSIS — J309 Allergic rhinitis, unspecified: Secondary | ICD-10-CM

## 2019-11-04 LAB — CBC
HCT: 41.3 % (ref 36.0–46.0)
Hemoglobin: 14.1 g/dL (ref 12.0–15.0)
MCHC: 34.2 g/dL (ref 30.0–36.0)
MCV: 95.5 fl (ref 78.0–100.0)
Platelets: 297 10*3/uL (ref 150.0–400.0)
RBC: 4.32 Mil/uL (ref 3.87–5.11)
RDW: 13.1 % (ref 11.5–15.5)
WBC: 7.2 10*3/uL (ref 4.0–10.5)

## 2019-11-04 LAB — URINALYSIS, ROUTINE W REFLEX MICROSCOPIC
Bilirubin Urine: NEGATIVE
Hgb urine dipstick: NEGATIVE
Ketones, ur: NEGATIVE
Nitrite: NEGATIVE
RBC / HPF: NONE SEEN (ref 0–?)
Specific Gravity, Urine: 1.015 (ref 1.000–1.030)
Total Protein, Urine: NEGATIVE
Urine Glucose: NEGATIVE
Urobilinogen, UA: 0.2 (ref 0.0–1.0)
pH: 6 (ref 5.0–8.0)

## 2019-11-04 LAB — COMPREHENSIVE METABOLIC PANEL
ALT: 98 U/L — ABNORMAL HIGH (ref 0–35)
AST: 61 U/L — ABNORMAL HIGH (ref 0–37)
Albumin: 4.4 g/dL (ref 3.5–5.2)
Alkaline Phosphatase: 82 U/L (ref 39–117)
BUN: 12 mg/dL (ref 6–23)
CO2: 29 mEq/L (ref 19–32)
Calcium: 9.7 mg/dL (ref 8.4–10.5)
Chloride: 101 mEq/L (ref 96–112)
Creatinine, Ser: 0.59 mg/dL (ref 0.40–1.20)
GFR: 107.42 mL/min (ref >60.00)
Glucose, Bld: 121 mg/dL — ABNORMAL HIGH (ref 70–99)
Potassium: 4 mEq/L (ref 3.5–5.1)
Sodium: 138 mEq/L (ref 135–145)
Total Bilirubin: 0.7 mg/dL (ref 0.2–1.2)
Total Protein: 7.1 g/dL (ref 6.0–8.3)

## 2019-11-04 LAB — TSH: TSH: 3.69 u[IU]/mL (ref 0.35–4.50)

## 2019-11-04 MED ORDER — AZELASTINE HCL 0.1 % NA SOLN: 2.0000 | Freq: Two times a day (BID) | NASAL | 12 refills | Status: DC

## 2019-11-04 MED FILL — AZELASTINE HCL 137 MCG SPRY: 0.1 | 30 days supply | Qty: 30 | Fill #0

## 2019-11-04 NOTE — Assessment & Plan Note (Signed)
Unsure if this is contributing to her dizziness.  We will add on Astelin.  Continue Flonase 2 sprays daily.

## 2019-11-04 NOTE — Progress Notes (Signed)
   Rachael Green is a 54 y.o. female who presents today for a virtual office visit.  Assessment/Plan:  New/Acute Problems: Dizziness Broad differential.  No red flags.  Possibly dehydration.  Possibly sinusitis/eustachian tube dysfunction given her ear popping.  Discussed limitations of virtual visit and inability to perform physical exam.  She will come in for labs including CBC, C met, TSH, and urinalysis.  Will start Astelin to treat underlying allergic rhinitis.  Chronic Problems Addressed Today: Allergic rhinitis Unsure if this is contributing to her dizziness.  We will add on Astelin.  Continue Flonase 2 sprays daily.  Hypothyroidism Patient recently came off Cytomel.  Possibly contributing to her dizziness.  We will have her come in and recheck TSH.    Subjective:  HPI:  Patient started having dizziness 4 days ago.  Described as a lightheaded sensation similar to when she stands up too quickly.  Symptoms improved a little bit yesterday but are still pervasive.  Much worse when bending down or getting up.  She is also had some ear popping as well.  No fever.  Temperature has been 99.  Is been taking Flonase with modest improvement.  She is concerned about blood pressure check her blood pressure stayed found to be 138/92.  She has been under more stress that she may going on a flight in a couple of days.  No syncope.  No weakness.  No numbness or paresthesias.  No vertiginous symptoms.  Has been trying to stay well-hydrated.  Patient donated blood about a month ago.       Objective/Observations  Physical Exam: Gen: NAD, resting comfortably Pulm: Normal work of breathing Neuro: Grossly normal, moves all extremities Psych: Normal affect and thought content  Virtual Visit via Video   I connected with Rachael Green on 11/04/19 at  8:40 AM EST by a video enabled telemedicine application and verified that I am speaking with the correct person using two identifiers. The limitations  of evaluation and management by telemedicine and the availability of in person appointments were discussed. The patient expressed understanding and agreed to proceed.   Patient location: Home Provider location: [place] participating in the virtual visit: Myself and Patient     Algis Greenhouse. Jerline Pain, MD 11/04/2019 9:17 AM

## 2019-11-04 NOTE — Assessment & Plan Note (Signed)
Patient recently came off Cytomel.  Possibly contributing to her dizziness.  We will have her come in and recheck TSH.

## 2019-11-05 ENCOUNTER — Telehealth: Payer: Self-pay

## 2019-11-05 NOTE — Progress Notes (Signed)
Please inform patient of the following:  Her liver numbers have increased since last visit but everything else is with expected ranges - no abnormalities that would explain her symptoms. I think it is safe for her to travel as we discussed. Would like for her to try the nasal spray we sent in and let us know if symptoms are not improving.

## 2019-11-05 NOTE — Telephone Encounter (Signed)
See lab notes

## 2019-11-05 NOTE — Telephone Encounter (Signed)
Patient calling about lab results. Pt states if she need medication she would like to have them before she leave for vacation tomorrow.please call pt back.

## 2019-11-24 MED FILL — FLUTICASONE PROP 50 MCG SPR: 50 | 30 days supply | Qty: 16 | Fill #2

## 2019-11-24 MED FILL — AZELASTINE HCL 137 MCG SPRY: 0.1 | 50 days supply | Qty: 30 | Fill #0

## 2020-03-20 ENCOUNTER — Telehealth: Payer: Self-pay | Admitting: Family Medicine

## 2020-03-20 NOTE — Telephone Encounter (Signed)
Patient was informed by insurance that she needs to complete CPE before 7/1.  I have patient scheduled for 7/29.  Could patient be worked in sooner.  Please advise.

## 2020-03-21 NOTE — Telephone Encounter (Signed)
Ok to use morning virtual slots - please make sure her CPE is for 40 minutes.  Algis Greenhouse. Jerline Pain, MD 03/21/2020 4:09 PM

## 2020-03-21 NOTE — Telephone Encounter (Signed)
OK to schedule for sooner appt?

## 2020-03-22 NOTE — Telephone Encounter (Signed)
Patient stated  no needed to change

## 2020-03-28 ENCOUNTER — Encounter: Payer: No Typology Code available for payment source | Admitting: Family Medicine

## 2020-04-27 ENCOUNTER — Encounter: Payer: No Typology Code available for payment source | Admitting: Family Medicine

## 2020-07-04 MED FILL — AZELASTINE HCL 137 MCG/SPRA: 137 | 25 days supply | Qty: 30 | Fill #1

## 2020-07-20 ENCOUNTER — Ambulatory Visit (INDEPENDENT_AMBULATORY_CARE_PROVIDER_SITE_OTHER): Payer: No Typology Code available for payment source

## 2020-07-20 ENCOUNTER — Encounter: Payer: Self-pay | Admitting: Family Medicine

## 2020-07-20 ENCOUNTER — Other Ambulatory Visit: Payer: Self-pay

## 2020-07-20 DIAGNOSIS — Z23 Encounter for immunization: Secondary | ICD-10-CM | POA: Diagnosis not present

## 2020-08-14 ENCOUNTER — Encounter: Payer: No Typology Code available for payment source | Admitting: Family Medicine

## 2020-12-21 ENCOUNTER — Other Ambulatory Visit: Payer: Self-pay

## 2020-12-21 ENCOUNTER — Encounter: Payer: Self-pay | Admitting: Obstetrics and Gynecology

## 2020-12-21 ENCOUNTER — Other Ambulatory Visit: Payer: Self-pay | Admitting: Obstetrics and Gynecology

## 2020-12-21 ENCOUNTER — Ambulatory Visit (INDEPENDENT_AMBULATORY_CARE_PROVIDER_SITE_OTHER): Payer: No Typology Code available for payment source | Admitting: Obstetrics and Gynecology

## 2020-12-21 ENCOUNTER — Other Ambulatory Visit (HOSPITAL_COMMUNITY)
Admission: RE | Admit: 2020-12-21 | Discharge: 2020-12-21 | Disposition: A | Discharge status: Home / Self Care | Payer: No Typology Code available for payment source | Source: Ambulatory Visit | Attending: Obstetrics and Gynecology | Admitting: Obstetrics and Gynecology

## 2020-12-21 VITALS — BP 122/78 | Ht 61.0 in | Wt 190.0 lb

## 2020-12-21 DIAGNOSIS — L700 Acne vulgaris: Secondary | ICD-10-CM

## 2020-12-21 DIAGNOSIS — Z124 Encounter for screening for malignant neoplasm of cervix: Secondary | ICD-10-CM | POA: Diagnosis not present

## 2020-12-21 DIAGNOSIS — N952 Postmenopausal atrophic vaginitis: Secondary | ICD-10-CM

## 2020-12-21 DIAGNOSIS — Z01419 Encounter for gynecological examination (general) (routine) without abnormal findings: Secondary | ICD-10-CM | POA: Diagnosis not present

## 2020-12-21 DIAGNOSIS — Z1211 Encounter for screening for malignant neoplasm of colon: Secondary | ICD-10-CM

## 2020-12-21 MED ORDER — ESTRADIOL 10 MCG VA TABS: ORAL_TABLET | VAGINAL | 4 refills | Status: DC

## 2020-12-21 MED FILL — ESTRADIOL 10 MCG TABS: 10 | 66 days supply | Qty: 24 | Fill #0

## 2020-12-21 NOTE — Progress Notes (Signed)
55 y.o. G0 female, Married to woman Other or two or more races Hispanic or Latino female here for annual exam. PMP, she has mild vasomotor symptoms (improved). She c/o vulvar/vaginal dryness. Some entry dyspareunia. Lubricant helped some.  H/O PCOS and oligomenorrhea. In her 36's she was cycling every 45-50 days. In her late 33's she was bleeding heavily and bad cramps.  She c/o increased acne on her back off and on, worse in the last few months. No hirsutism. No deepening of her and no clitoromegaly.   Normal bowel and bladder function.   She has lost 12 lbs since January, on weight watchers.     Patient's last menstrual period was 05/23/2018.          Sexually active: Yes.    The current method of family planning is post menopausal status.    Exercising: yes exercise 30 min per day 3 times per week Smoker:  no  Health Maintenance: Pap:  2019 History of abnormal Pap:  no MMG:  2020  BMD:   Never had one  Colonoscopy: she had one in her 35's and one in her 92's (thinks more than 10 years).  TDaP:  2019 Gardasil: never had one   reports that she has never smoked. She has never used smokeless tobacco. She reports current alcohol use of about 2.0 standard drinks of alcohol per week. She reports that she does not use drugs. She is from [place], moved her in 2019 with her wife. Homemaker. Wife works in the [place] at [employer].  Past Medical History:  Diagnosis Date  . Allergy   . Anxiety   . Depression   . Diverticulosis   . GERD (gastroesophageal reflux disease)   . Hypertension   . Thyroid disease   Doing well from anxiety and depression standpoint.   Past Surgical History:  Procedure Laterality Date  . ARTHROSCOPIC REPAIR ACL     left  . WISDOM TOOTH EXTRACTION      Current Outpatient Medications  Medication Sig Dispense Refill  . azelastine (ASTELIN) 0.1 % nasal spray Place 2 sprays into both nostrils 2 (two) times daily. 30 mL 12  . B Complex Vitamins (VITAMIN B  COMPLEX PO) Take by mouth.    . fluticasone (FLONASE) 50 MCG/ACT nasal spray Place 2 sprays into both nostrils daily. 16 g 3  . Multiple Vitamin (MULTIVITAMIN) capsule Take 1 capsule by mouth daily.    . Omega-3 Fatty Acids (FISH OIL PO) Take by mouth.    Marland Kitchen VITAMIN D PO Take by mouth.     No current facility-administered medications for this visit.    Family History  Problem Relation Age of Onset  . Depression Mother   . Kidney cancer Mother   . Leukemia Maternal Uncle     Review of Systems  All other systems reviewed and are negative.   Exam:   BP 122/78 (BP Location: Right Arm, Patient Position: Sitting, Cuff Size: Normal)   Ht 5\' 1"  (1.549 m)   Wt 190 lb (86.2 kg)   LMP 05/23/2018   BMI 35.90 kg/m   Weight change: @WEIGHTCHANGE @ Height:   Height: 5\' 1"  (154.9 cm)  Ht Readings from Last 3 Encounters:  12/21/20 5\' 1"  (1.549 m)  03/10/19 5\' 1"  (1.549 m)  11/11/18 5\' 1"  (1.549 m)    General appearance: alert, cooperative and appears stated age Head: Normocephalic, without obvious abnormality, atraumatic Neck: no adenopathy, supple, symmetrical, trachea midline and thyroid normal to inspection and palpation Lungs:  clear to auscultation bilaterally Cardiovascular: regular rate and rhythm Breasts: normal appearance, no masses or tenderness Abdomen: soft, non-tender; non distended,  no masses,  no organomegaly Extremities: extremities normal, atraumatic, no cyanosis or edema Skin: Skin color, texture, turgor normal. No rashes. Mild to moderate acne on her back Lymph nodes: Cervical, supraclavicular, and axillary nodes normal. No abnormal inguinal nodes palpated Neurologic: Grossly normal   Pelvic: External genitalia:  no lesions              Urethra:  normal appearing urethra with no masses, tenderness or lesions              Bartholins and Skenes: normal                 Vagina: atrophic appearing vagina with normal color and discharge, no lesions              Cervix: no  lesions               Bimanual Exam:  Uterus:  no masses or tenderness              Adnexa: no mass, fullness, tenderness               Rectovaginal: Confirms               Anus:  normal sphincter tone, no lesions  Gae Dry chaperoned for the exam.  1. Well woman exam Discussed breast self exam Discussed calcium and vit D intake Labs with primary Mammogram # given, she will schedule  2. Screening for cervical cancer - Cytology - PAP with hpv  3. Colon cancer screening - Ambulatory referral to Gastroenterology  4. Acne vulgaris Dermatology list given  5. Atrophic vaginitis No contraindications to vaginal estrogen - Estradiol 10 MCG TABS vaginal tablet; Place one tablet vaginally qhs x 1 week, then change to 2 x a week  Dispense: 24 tablet; Refill: 4 -vulvar skin care sheet also given

## 2020-12-21 NOTE — Patient Instructions (Addendum)
EXERCISE   We recommended that you start or continue a regular exercise program for good health. Physical activity is anything that gets your body moving, some is better than none. The CDC recommends 150 minutes per week of Moderate-Intensity Aerobic Activity and 2 or more days of Muscle Strengthening Activity.  Benefits of exercise are limitless: helps weight loss/weight maintenance, improves mood and energy, helps with depression and anxiety, improves sleep, tones and strengthens muscles, improves balance, improves bone density, protects from chronic conditions such as heart disease, high blood pressure and diabetes and so much more. To learn more visit: https://www.cdc.gov/physicalactivity/index.html  DIET: Good nutrition starts with a healthy diet of fruits, vegetables, whole grains, and lean protein sources. Drink plenty of water for hydration. Minimize empty calories, sodium, sweets. For more information about dietary recommendations visit: https://health.gov/our-work/nutrition-physical-activity/dietary-guidelines and https://www.myplate.gov/  ALCOHOL:  Women should limit their alcohol intake to no more than 7 drinks/beers/glasses of wine (combined, not each!) per week. Moderation of alcohol intake to this level decreases your risk of breast cancer and liver damage.  If you are concerned that you may have a problem, or your friends have told you they are concerned about your drinking, there are many resources to help. A well-known program that is free, effective, and available to all people all over the nation is Alcoholics Anonymous.  Check out this site to learn more: https://www.aa.org/   CALCIUM AND VITAMIN D:  Adequate intake of calcium and Vitamin D are recommended for bone health.  You should be getting between 1000-1200 mg of calcium and 800 units of Vitamin D daily between diet and supplements  PAP SMEARS:  Pap smears, to check for cervical cancer or precancers,  have traditionally been  done yearly, scientific advances have shown that most women can have pap smears less often.  However, every woman still should have a physical exam from her gynecologist every year. It will include a breast check, inspection of the vulva and vagina to check for abnormal growths or skin changes, a visual exam of the cervix, and then an exam to evaluate the size and shape of the uterus and ovaries. We will also provide age appropriate advice regarding health maintenance, like when you should have certain vaccines, screening for sexually transmitted diseases, bone density testing, colonoscopy, mammograms, etc.   MAMMOGRAMS:  All women over 40 years old should have a routine mammogram.   COLON CANCER SCREENING: Now recommend starting at age 45. At this time colonoscopy is not covered for routine screening until 50. There are take home tests that can be done between 45-49.   COLONOSCOPY:  Colonoscopy to screen for colon cancer is recommended for all women at age 50.  We know, you hate the idea of the prep.  We agree, BUT, having colon cancer and not knowing it is worse!!  Colon cancer so often starts as a polyp that can be seen and removed at colonscopy, which can quite literally save your life!  And if your first colonoscopy is normal and you have no family history of colon cancer, most women don't have to have it again for 10 years.  Once every ten years, you can do something that may end up saving your life, right?  We will be happy to help you get it scheduled when you are ready.  Be sure to check your insurance coverage so you understand how much it will cost.  It may be covered as a preventative service at no cost, but you should check   your particular policy.      Breast Self-Awareness Breast self-awareness means being familiar with how your breasts look and feel. It involves checking your breasts regularly and reporting any changes to your health care provider. Practicing breast self-awareness is  important. A change in your breasts can be a sign of a serious medical problem. Being familiar with how your breasts look and feel allows you to find any problems early, when treatment is more likely to be successful. All women should practice breast self-awareness, including women who have had breast implants. How to do a breast self-exam One way to learn what is normal for your breasts and whether your breasts are changing is to do a breast self-exam. To do a breast self-exam: Look for Changes  1. Remove all the clothing above your waist. 2. Stand in front of a mirror in a room with good lighting. 3. Put your hands on your hips. 4. Push your hands firmly downward. 5. Compare your breasts in the mirror. Look for differences between them (asymmetry), such as: ? Differences in shape. ? Differences in size. ? Puckers, dips, and bumps in one breast and not the other. 6. Look at each breast for changes in your skin, such as: ? Redness. ? Scaly areas. 7. Look for changes in your nipples, such as: ? Discharge. ? Bleeding. ? Dimpling. ? Redness. ? A change in position. Feel for Changes Carefully feel your breasts for lumps and changes. It is best to do this while lying on your back on the floor and again while sitting or standing in the shower or tub with soapy water on your skin. Feel each breast in the following way:  Place the arm on the side of the breast you are examining above your head.  Feel your breast with the other hand.  Start in the nipple area and make  inch (2 cm) overlapping circles to feel your breast. Use the pads of your three middle fingers to do this. Apply light pressure, then medium pressure, then firm pressure. The light pressure will allow you to feel the tissue closest to the skin. The medium pressure will allow you to feel the tissue that is a little deeper. The firm pressure will allow you to feel the tissue close to the ribs.  Continue the overlapping circles,  moving downward over the breast until you feel your ribs below your breast.  Move one finger-width toward the center of the body. Continue to use the  inch (2 cm) overlapping circles to feel your breast as you move slowly up toward your collarbone.  Continue the up and down exam using all three pressures until you reach your armpit.  Write Down What You Find  Write down what is normal for each breast and any changes that you find. Keep a written record with breast changes or normal findings for each breast. By writing this information down, you do not need to depend only on memory for size, tenderness, or location. Write down where you are in your menstrual cycle, if you are still menstruating. If you are having trouble noticing differences in your breasts, do not get discouraged. With time you will become more familiar with the variations in your breasts and more comfortable with the exam. How often should I examine my breasts? Examine your breasts every month. If you are breastfeeding, the best time to examine your breasts is after a feeding or after using a breast pump. If you menstruate, the best time to   examine your breasts is 5-7 days after your period is over. During your period, your breasts are lumpier, and it may be more difficult to notice changes. When should I see my health care provider? See your health care provider if you notice:  A change in shape or size of your breasts or nipples.  A change in the skin of your breast or nipples, such as a reddened or scaly area.  Unusual discharge from your nipples.  A lump or thick area that was not there before.  Pain in your breasts.  Anything that concerns you.  Atrophic Vaginitis  Atrophic vaginitis is a condition in which the tissues that line the vagina become dry and thin. This condition is most common in women who have stopped having regular menstrual periods (are in menopause). This usually starts when a woman is 63 to 53  years old. That is the time when a woman's estrogen levels begin to decrease. Estrogen is a female hormone. It helps to keep the tissues of the vagina moist. It stimulates the vagina to produce a clear fluid that lubricates the vagina for sex. This fluid also protects the vagina from infection. Lack of estrogen can cause the lining of the vagina to get thinner and dryer. The vagina may also shrink in size. It may become less elastic. Atrophic vaginitis tends to get worse over time as a woman's estrogen level drops. What are the causes? This condition is caused by the normal drop in estrogen that happens around the time of menopause. What increases the risk? Certain conditions or situations may lower a woman's estrogen level, leading to a higher risk for atrophic vaginitis. You are more likely to develop this condition if:  You are taking medicines that block estrogen.  You have had your ovaries removed.  You are being treated for cancer with radiation or medicines (chemotherapy).  You have given birth or are breastfeeding.  You are older than age 81.  You smoke. What are the signs or symptoms? Symptoms of this condition include:  Pain, soreness, a feeling of pressure, or bleeding during sex (dyspareunia).  Vaginal burning, irritation, or itching.  Pain or bleeding when a speculum is used in a vaginal exam.  Having burning pain while urinating.  Vaginal discharge. In some cases, there are no symptoms. How is this diagnosed? This condition is diagnosed based on your medical history and a physical exam. This will include a pelvic exam that checks the vaginal tissues. Though rare, you may also have other tests, including:  A urine test.  A test that checks the acid balance in your vagina (acid balance test). How is this treated? Treatment for this condition depends on how severe your symptoms are. Treatment may include:  Using an over-the-counter vaginal lubricant before  sex.  Using a long-acting vaginal moisturizer.  Using low-dose estrogen for moderate to severe symptoms that do not respond to other treatments. Options include creams, tablets, and inserts (vaginal rings). Before you use a vaginal estrogen, tell your health care provider if you have a history of: ? Breast cancer. ? Endometrial cancer. ? Blood clots. If you are not sexually active and your symptoms are very mild, you may not need treatment. Follow these instructions at home: Medicines  Take over-the-counter and prescription medicines only as told by your health care provider.  Do not use herbal or alternative medicines unless your health care provider says that you can.  Use over-the-counter creams, lubricants, or moisturizers for dryness only as  told by your health care provider. General instructions  If your atrophic vaginitis is caused by menopause, discuss all of your menopause symptoms and treatment options with your health care provider.  Do not douche.  Do not use products that can make your vagina dry. These include: ? Scented feminine sprays. ? Scented tampons. ? Scented soaps.  Vaginal sex can help to improve blood flow and elasticity of vaginal tissue. If you choose to have sex and it hurts, try using a water-soluble lubricant or moisturizer right before having sex. Contact a health care provider if:  Your discharge looks different than normal.  Your vagina has an unusual smell.  You have new symptoms.  Your symptoms do not improve with treatment.  Your symptoms get worse. Summary  Atrophic vaginitis is a condition in which the tissues that line the vagina become dry and thin. It is most common in women who have stopped having regular menstrual periods (are in menopause).  Treatment options include using vaginal lubricants and low-dose vaginal estrogen.  Contact a health care provider if your vagina has an unusual smell, or if your symptoms get worse or do not  improve after treatment. This information is not intended to replace advice given to you by your health care provider. Make sure you discuss any questions you have with your health care provider. Document Revised: 03/16/2020 Document Reviewed: 03/16/2020 Elsevier Patient Education  [place].

## 2020-12-21 NOTE — Progress Notes (Deleted)
55 y.o. No obstetric history on file. Married Other or two or more races Hispanic or Latino female here for annual exam.      No LMP recorded.          Sexually active: {yes no:314532}  The current method of family planning is {contraception:315051}.    Exercising: {yes no:314532}  {types:19826} Smoker:  {YES NO:22349}  Health Maintenance: Pap:  *** History of abnormal Pap:  {YES NO:22349} MMG:  *** BMD:   *** Colonoscopy: *** TDaP:  *** Gardasil: ***   reports that she has never smoked. She has never used smokeless tobacco. She reports current alcohol use of about 2.0 standard drinks of alcohol per week. She reports that she does not use drugs.  Past Medical History:  Diagnosis Date  . Allergy   . Anxiety   . Depression   . Diverticulosis   . GERD (gastroesophageal reflux disease)   . Hypertension   . Thyroid disease     Past Surgical History:  Procedure Laterality Date  . ARTHROSCOPIC REPAIR ACL     left  . WISDOM TOOTH EXTRACTION      Current Outpatient Medications  Medication Sig Dispense Refill  . azelastine (ASTELIN) 0.1 % nasal spray Place 2 sprays into both nostrils 2 (two) times daily. 30 mL 12  . fluticasone (FLONASE) 50 MCG/ACT nasal spray Place 2 sprays into both nostrils daily. 16 g 3   No current facility-administered medications for this visit.    Family History  Problem Relation Age of Onset  . Depression Mother   . Kidney cancer Mother   . Leukemia Maternal Uncle     Review of Systems  Exam:   There were no vitals taken for this visit.  Weight change: @WEIGHTCHANGE @ Height:      Ht Readings from Last 3 Encounters:  03/10/19 5\' 1"  (1.549 m)  11/11/18 5\' 1"  (1.549 m)    General appearance: alert, cooperative and appears stated age Head: Normocephalic, without obvious abnormality, atraumatic Neck: no adenopathy, supple, symmetrical, trachea midline and thyroid {CHL AMB PHY EX THYROID NORM DEFAULT:[phone]::"normal to inspection and  palpation"} Lungs: clear to auscultation bilaterally Cardiovascular: regular rate and rhythm Breasts: {Exam; breast:13139::"normal appearance, no masses or tenderness"} Abdomen: soft, non-tender; non distended,  no masses,  no organomegaly Extremities: extremities normal, atraumatic, no cyanosis or edema Skin: Skin color, texture, turgor normal. No rashes or lesions Lymph nodes: Cervical, supraclavicular, and axillary nodes normal. No abnormal inguinal nodes palpated Neurologic: Grossly normal   Pelvic: External genitalia:  no lesions              Urethra:  normal appearing urethra with no masses, tenderness or lesions              Bartholins and Skenes: normal                 Vagina: normal appearing vagina with normal color and discharge, no lesions              Cervix: {CHL AMB PHY EX CERVIX NORM DEFAULT:[phone]::"no lesions"}               Bimanual Exam:  Uterus:  {CHL AMB PHY EX UTERUS NORM [phone]::"normal size, contour, position, consistency, mobility, non-tender"}              Adnexa: {CHL AMB PHY EX ADNEXA NO MASS DEFAULT:[phone]::"no mass, fullness, tenderness"}               Rectovaginal: Confirms  Anus:  normal sphincter tone, no lesions  *** chaperoned for the exam.  A:  Well Woman with normal exam  P:

## 2020-12-22 LAB — CYTOLOGY - PAP
Comment: NEGATIVE
Diagnosis: NEGATIVE
High risk HPV: NEGATIVE

## 2021-01-23 ENCOUNTER — Other Ambulatory Visit (HOSPITAL_COMMUNITY): Payer: Self-pay

## 2021-01-23 MED ORDER — LATANOPROST 0.005 % OP SOLN
OPHTHALMIC | 3 refills | Status: DC
  Filled 2021-01-23: qty 7.5, 75d supply, fill #0
  Filled 2021-04-25: qty 7.5, 75d supply, fill #1
  Filled 2021-10-02: qty 7.5, 75d supply, fill #2

## 2021-02-07 ENCOUNTER — Encounter: Payer: Self-pay | Admitting: Family Medicine

## 2021-02-07 ENCOUNTER — Other Ambulatory Visit: Payer: Self-pay

## 2021-02-07 ENCOUNTER — Ambulatory Visit (INDEPENDENT_AMBULATORY_CARE_PROVIDER_SITE_OTHER): Payer: No Typology Code available for payment source | Admitting: Family Medicine

## 2021-02-07 ENCOUNTER — Other Ambulatory Visit (HOSPITAL_COMMUNITY): Payer: Self-pay

## 2021-02-07 VITALS — BP 112/75 | HR 66 | Temp 97.7°F | Ht 61.0 in | Wt 190.2 lb

## 2021-02-07 DIAGNOSIS — I10 Essential (primary) hypertension: Secondary | ICD-10-CM | POA: Diagnosis not present

## 2021-02-07 DIAGNOSIS — F325 Major depressive disorder, single episode, in full remission: Secondary | ICD-10-CM

## 2021-02-07 DIAGNOSIS — Z0001 Encounter for general adult medical examination with abnormal findings: Secondary | ICD-10-CM

## 2021-02-07 DIAGNOSIS — J309 Allergic rhinitis, unspecified: Secondary | ICD-10-CM

## 2021-02-07 DIAGNOSIS — E039 Hypothyroidism, unspecified: Secondary | ICD-10-CM

## 2021-02-07 DIAGNOSIS — H409 Unspecified glaucoma: Secondary | ICD-10-CM | POA: Insufficient documentation

## 2021-02-07 DIAGNOSIS — E559 Vitamin D deficiency, unspecified: Secondary | ICD-10-CM | POA: Diagnosis not present

## 2021-02-07 DIAGNOSIS — E785 Hyperlipidemia, unspecified: Secondary | ICD-10-CM | POA: Diagnosis not present

## 2021-02-07 DIAGNOSIS — E669 Obesity, unspecified: Secondary | ICD-10-CM

## 2021-02-07 DIAGNOSIS — R739 Hyperglycemia, unspecified: Secondary | ICD-10-CM | POA: Diagnosis not present

## 2021-02-07 DIAGNOSIS — Z6835 Body mass index (BMI) 35.0-35.9, adult: Secondary | ICD-10-CM

## 2021-02-07 LAB — COMPREHENSIVE METABOLIC PANEL
ALT: 28 U/L (ref 0–35)
AST: 27 U/L (ref 0–37)
Albumin: 4.3 g/dL (ref 3.5–5.2)
Alkaline Phosphatase: 85 U/L (ref 39–117)
BUN: 11 mg/dL (ref 6–23)
CO2: 29 mEq/L (ref 19–32)
Calcium: 10 mg/dL (ref 8.4–10.5)
Chloride: 102 mEq/L (ref 96–112)
Creatinine, Ser: 0.6 mg/dL (ref 0.40–1.20)
GFR: 103.24 mL/min (ref >60.00)
Glucose, Bld: 91 mg/dL (ref 70–99)
Potassium: 4.1 mEq/L (ref 3.5–5.1)
Sodium: 141 mEq/L (ref 135–145)
Total Bilirubin: 0.6 mg/dL (ref 0.2–1.2)
Total Protein: 7.4 g/dL (ref 6.0–8.3)

## 2021-02-07 LAB — CBC
HCT: 42.6 % (ref 36.0–46.0)
Hemoglobin: 14.4 g/dL (ref 12.0–15.0)
MCHC: 33.7 g/dL (ref 30.0–36.0)
MCV: 95.4 fl (ref 78.0–100.0)
Platelets: 275 10*3/uL (ref 150.0–400.0)
RBC: 4.46 Mil/uL (ref 3.87–5.11)
RDW: 13.7 % (ref 11.5–15.5)
WBC: 7.2 10*3/uL (ref 4.0–10.5)

## 2021-02-07 LAB — LIPID PANEL
Cholesterol: 234 mg/dL — ABNORMAL HIGH (ref 0–200)
HDL: 64.3 mg/dL (ref >39.00)
LDL Cholesterol: 149 mg/dL — ABNORMAL HIGH (ref 0–99)
NonHDL: 169.9
Total CHOL/HDL Ratio: 4
Triglycerides: 103 mg/dL (ref 0.0–149.0)
VLDL: 20.6 mg/dL (ref 0.0–40.0)

## 2021-02-07 LAB — TSH: TSH: 2.47 u[IU]/mL (ref 0.35–4.50)

## 2021-02-07 LAB — HEMOGLOBIN A1C: Hgb A1c MFr Bld: 5.2 % (ref 4.6–6.5)

## 2021-02-07 LAB — VITAMIN D 25 HYDROXY (VIT D DEFICIENCY, FRACTURES): VITD: 42.86 ng/mL (ref 30.00–100.00)

## 2021-02-07 MED ORDER — AZELASTINE HCL 0.1 % NA SOLN
2.0000 | Freq: Two times a day (BID) | NASAL | 12 refills | Status: DC
  Filled 2021-02-07: qty 30, 25d supply, fill #0
  Filled 2021-12-31: qty 30, 25d supply, fill #1

## 2021-02-07 NOTE — Assessment & Plan Note (Signed)
At goal today off medications. 

## 2021-02-07 NOTE — Assessment & Plan Note (Signed)
Stable off meds. ?

## 2021-02-07 NOTE — Assessment & Plan Note (Signed)
Recently diagnosed at ophthalmology.  She is on latanoprost drops.

## 2021-02-07 NOTE — Progress Notes (Signed)
Chief Complaint:  Rachael Green is a 55 y.o. female who presents today for her annual comprehensive physical exam.    Assessment/Plan:  Chronic Problems Addressed Today: Glaucoma Recently diagnosed at ophthalmology.  She is on latanoprost drops.  Hyperglycemia Check A1c.  Allergic rhinitis Avoiding intranasal steroids due to glaucoma.  We will continue Astelin.  Dyslipidemia Discussed lifestyle modifications.  We will check lipids today.  Hypothyroidism Not currently on any medications.  We will check TSH.  Essential hypertension At goal today off medications.  Depression, major, in remission ([place]) Stable off meds.   Body mass index is 35.94 kg/m. / Obese  BMI Metric Follow Up - 02/07/21 1000      BMI Metric Follow Up-Please document annually   BMI Metric Follow Up Education provided            Preventative Healthcare: Due for mammogram and colon cancer screening.  Will check labs today.  Patient Counseling(The following topics were reviewed and/or handout was given):  -Nutrition: Stressed importance of moderation in sodium/caffeine intake, saturated fat and cholesterol, caloric balance, sufficient intake of fresh fruits, vegetables, and fiber.  -Stressed the importance of regular exercise.   -Substance Abuse: Discussed cessation/primary prevention of tobacco, alcohol, or other drug use; driving or other dangerous activities under the influence; availability of treatment for abuse.   -Injury prevention: Discussed safety belts, safety helmets, smoke detector, smoking near bedding or upholstery.   -Sexuality: Discussed sexually transmitted diseases, partner selection, use of condoms, avoidance of unintended pregnancy and contraceptive alternatives.   -Dental health: Discussed importance of regular tooth brushing, flossing, and dental visits.  -Health maintenance and immunizations reviewed. Please refer to Health maintenance section.  Return to care in 1 year for  next preventative visit.     Subjective:  HPI:  She has no acute complaints today.   Lifestyle Diet: Balanced.  Gets plenty of fruits and vegetables. Exercise: Trying to be more active.   Depression screen [facility] 2/9 03/10/2019  Decreased Interest 0  Down, Depressed, Hopeless 0  PHQ - 2 Score 0  Altered sleeping 3  Tired, decreased energy 0  Change in appetite 0  Feeling bad or failure about yourself  0  Trouble concentrating 0  Moving slowly or fidgety/restless 0  Suicidal thoughts 0  PHQ-9 Score 3  Difficult doing work/chores -    Health Maintenance Due  Topic Date Due  . HIV Screening  Never done  . Hepatitis C Screening  Never done  . COLONOSCOPY (Pts 45-32yrs Insurance coverage will need to be confirmed)  Never done     ROS: Per HPI, otherwise a complete review of systems was negative.   PMH:  The following were reviewed and entered/updated in epic: Past Medical History:  Diagnosis Date  . Allergy   . Anxiety   . Depression   . Diverticulosis   . GERD (gastroesophageal reflux disease)   . Hypertension   . Thyroid disease    Patient Active Problem List   Diagnosis Date Noted  . Glaucoma 02/07/2021  . Hyperglycemia 03/11/2019  . Essential hypertension 11/11/2018  . Hypothyroidism 11/11/2018  . Depression, major, in remission ([place]) 11/11/2018  . Anxiety 11/11/2018  . GERD (gastroesophageal reflux disease) 11/11/2018  . Dyslipidemia 11/11/2018  . Allergic rhinitis 11/11/2018  . Hearing loss 11/11/2018  . OSA (obstructive sleep apnea) 11/11/2018  . Steatosis of liver 01/09/2018  . Lipoma of upper arm 05/10/2016  . Insomnia 03/17/2012   Past Surgical History:  Procedure Laterality Date  .  ARTHROSCOPIC REPAIR ACL     left  . WISDOM TOOTH EXTRACTION      Family History  Problem Relation Age of Onset  . Depression Mother   . Kidney cancer Mother   . Leukemia Maternal Uncle     Medications- reviewed and updated Current Outpatient Medications   Medication Sig Dispense Refill  . B Complex Vitamins (VITAMIN B COMPLEX PO) Take by mouth.    . Estradiol 10 MCG TABS vaginal tablet PLACE 1 TABLET VAGINALLY ONCE A DAY AT BEDTIME X 1 WEEK, THEN CHANGE TO 2 X A WEEK 24 tablet 4  . latanoprost (XALATAN) 0.005 % ophthalmic solution Place 1 drop into both eyes in the evening 7.5 mL 3  . Multiple Vitamin (MULTIVITAMIN) capsule Take 1 capsule by mouth daily.    . Omega-3 Fatty Acids (FISH OIL PO) Take by mouth.    Marland Kitchen VITAMIN D PO Take by mouth.    Marland Kitchen azelastine (ASTELIN) 0.1 % nasal spray Place 2 sprays into both nostrils 2 (two) times daily. 30 mL 12   No current facility-administered medications for this visit.    Allergies-reviewed and updated No Known Allergies  Social History   Socioeconomic History  . Marital status: Married    Spouse name: Not on file  . Number of children: Not on file  . Years of education: Not on file  . Highest education level: Not on file  Occupational History  . Not on file  Tobacco Use  . Smoking status: Never Smoker  . Smokeless tobacco: Never Used  Vaping Use  . Vaping Use: Never used  Substance and Sexual Activity  . Alcohol use: Yes    Alcohol/week: 2.0 standard drinks    Types: 2 Glasses of wine per week  . Drug use: Never  . Sexual activity: Yes    Birth control/protection: None, Post-menopausal  Other Topics Concern  . Not on file  Social History Narrative  . Not on file   Social Determinants of Health   Financial Resource Strain: Not on file  Food Insecurity: Not on file  Transportation Needs: Not on file  Physical Activity: Not on file  Stress: Not on file  Social Connections: Not on file        Objective:  Physical Exam: BP 112/75   Pulse 66   Temp 97.7 F (36.5 C) (Temporal)   Ht 5\' 1"  (1.549 m)   Wt 190 lb 3.2 oz (86.3 kg)   LMP 07/11/2018   SpO2 98%   BMI 35.94 kg/m   Body mass index is 35.94 kg/m. Wt Readings from Last 3 Encounters:  02/07/21 190 lb 3.2 oz  (86.3 kg)  12/21/20 190 lb (86.2 kg)  03/10/19 195 lb 4 oz (88.6 kg)   Gen: NAD, resting comfortably HEENT: TMs normal bilaterally. OP clear. No thyromegaly noted.  CV: RRR with no murmurs appreciated Pulm: NWOB, CTAB with no crackles, wheezes, or rhonchi GI: Normal bowel sounds present. Soft, Nontender, Nondistended. MSK: no edema, cyanosis, or clubbing noted Skin: warm, dry Neuro: CN2-12 grossly intact. Strength 5/5 in upper and lower extremities. Reflexes symmetric and intact bilaterally.  Psych: Normal affect and thought content     Laquinda Moller M. Jerline Pain, MD 02/07/2021 10:04 AM

## 2021-02-07 NOTE — Assessment & Plan Note (Signed)
Discussed lifestyle modifications.  We will check lipids today. ?

## 2021-02-07 NOTE — Assessment & Plan Note (Signed)
Not currently on any medications.  We will check TSH.

## 2021-02-07 NOTE — Patient Instructions (Signed)
It was very nice to see you today!  We will check blood work today.  Please continue working on diet and exercise.  You are due for mammogram and colonoscopy.  I will see back in year.  Come back to see me sooner if needed.  Take care, Dr Jerline Pain  PLEASE NOTE:  If you had any lab tests please let us know if you have not heard back within a few days. You may see your results on mychart before we have a chance to review them but we will give you a call once they are reviewed by [place]. If we ordered any referrals today, please let us know if you have not heard from their office within the next week.   Please try these tips to maintain a healthy lifestyle:   Eat at least 3 REAL meals and 1-2 snacks per day.  Aim for no more than 5 hours between eating.  If you eat breakfast, please do so within one hour of getting up.    Each meal should contain half fruits/vegetables, one quarter protein, and one quarter carbs (no bigger than a computer mouse)   Cut down on sweet beverages. This includes juice, soda, and sweet tea.     Drink at least 1 glass of water with each meal and aim for at least 8 glasses per day   Exercise at least 150 minutes every week.    Preventive Care 53-7 Years Old, Female Preventive care refers to lifestyle choices and visits with your health care provider that can promote health and wellness. This includes:  A yearly physical exam. This is also called an annual wellness visit.  Regular dental and eye exams.  Immunizations.  Screening for certain conditions.  Healthy lifestyle choices, such as: ? Eating a healthy diet. ? Getting regular exercise. ? Not using drugs or products that contain nicotine and tobacco. ? Limiting alcohol use. What can I expect for my preventive care visit? Physical exam Your health care provider will check your:  Height and weight. These may be used to calculate your BMI (body mass index). BMI is a measurement that tells if you  are at a healthy weight.  Heart rate and blood pressure.  Body temperature.  Skin for abnormal spots. Counseling Your health care provider may ask you questions about your:  Past medical problems.  Family's medical history.  Alcohol, tobacco, and drug use.  Emotional well-being.  Home life and relationship well-being.  Sexual activity.  Diet, exercise, and sleep habits.  Work and work Statistician.  Access to firearms.  Method of birth control.  Menstrual cycle.  Pregnancy history. What immunizations do I need? Vaccines are usually given at various ages, according to a schedule. Your health care provider will recommend vaccines for you based on your age, medical history, and lifestyle or other factors, such as travel or where you work.   What tests do I need? Blood tests  Lipid and cholesterol levels. These may be checked every 5 years, or more often if you are over 75 years old.  Hepatitis C test.  Hepatitis B test. Screening  Lung cancer screening. You may have this screening every year starting at age 81 if you have a 30-pack-year history of smoking and currently smoke or have quit within the past 15 years.  Colorectal cancer screening. ? All adults should have this screening starting at age 9 and continuing until age 65. ? Your health care provider may recommend screening at age 23  if you are at increased risk. ? You will have tests every 1-10 years, depending on your results and the type of screening test.  Diabetes screening. ? This is done by checking your blood sugar (glucose) after you have not eaten for a while (fasting). ? You may have this done every 1-3 years.  Mammogram. ? This may be done every 1-2 years. ? Talk with your health care provider about when you should start having regular mammograms. This may depend on whether you have a family history of breast cancer.  BRCA-related cancer screening. This may be done if you have a family history  of breast, ovarian, tubal, or peritoneal cancers.  Pelvic exam and Pap test. ? This may be done every 3 years starting at age 61. ? Starting at age 82, this may be done every 5 years if you have a Pap test in combination with an HPV test. Other tests  STD (sexually transmitted disease) testing, if you are at risk.  Bone density scan. This is done to screen for osteoporosis. You may have this scan if you are at high risk for osteoporosis. Talk with your health care provider about your test results, treatment options, and if necessary, the need for more tests. Follow these instructions at home: Eating and drinking  Eat a diet that includes fresh fruits and vegetables, whole grains, lean protein, and low-fat dairy products.  Take vitamin and mineral supplements as recommended by your health care provider.  Do not drink alcohol if: ? Your health care provider tells you not to drink. ? You are pregnant, may be pregnant, or are planning to become pregnant.  If you drink alcohol: ? Limit how much you have to 0-1 drink a day. ? Be aware of how much alcohol is in your drink. In the [place] one drink equals one 12 oz bottle of beer (355 mL), one 5 oz glass of wine (148 mL), or one 1 oz glass of hard liquor (44 mL).   Lifestyle  Take daily care of your teeth and gums. Brush your teeth every morning and night with fluoride toothpaste. Floss one time each day.  Stay active. Exercise for at least 30 minutes 5 or more days each week.  Do not use any products that contain nicotine or tobacco, such as cigarettes, e-cigarettes, and chewing tobacco. If you need help quitting, ask your health care provider.  Do not use drugs.  If you are sexually active, practice safe sex. Use a condom or other form of protection to prevent STIs (sexually transmitted infections).  If you do not wish to become pregnant, use a form of birth control. If you plan to become pregnant, see your health care provider for a  prepregnancy visit.  If told by your health care provider, take low-dose aspirin daily starting at age 73.  Find healthy ways to cope with stress, such as: ? Meditation, yoga, or listening to music. ? Journaling. ? Talking to a trusted person. ? Spending time with friends and family. Safety  Always wear your seat belt while driving or riding in a vehicle.  Do not drive: ? If you have been drinking alcohol. Do not ride with someone who has been drinking. ? When you are tired or distracted. ? While texting.  Wear a helmet and other protective equipment during sports activities.  If you have firearms in your house, make sure you follow all gun safety procedures. What's next?  Visit your health care provider once a year  for an annual wellness visit.  Ask your health care provider how often you should have your eyes and teeth checked.  Stay up to date on all vaccines. This information is not intended to replace advice given to you by your health care provider. Make sure you discuss any questions you have with your health care provider. Document Revised: 06/20/2020 Document Reviewed: 05/28/2018 Elsevier Patient Education  2021 [employer].

## 2021-02-07 NOTE — Assessment & Plan Note (Signed)
Check A1c. 

## 2021-02-07 NOTE — Assessment & Plan Note (Signed)
Avoiding intranasal steroids due to glaucoma.  We will continue Astelin.

## 2021-02-08 NOTE — Progress Notes (Signed)
Please inform patient of the following:  Cholesterol level elevated compared to last time.  Do not need to start meds but she should work on diet and exercise.  Everything else is normal.  We can recheck in year.

## 2021-02-10 ENCOUNTER — Other Ambulatory Visit (HOSPITAL_COMMUNITY): Payer: Self-pay

## 2021-03-22 ENCOUNTER — Encounter: Payer: Self-pay | Admitting: Family Medicine

## 2021-03-22 DIAGNOSIS — H409 Unspecified glaucoma: Secondary | ICD-10-CM

## 2021-03-22 NOTE — Telephone Encounter (Signed)
Referral placed in Epic.

## 2021-03-22 NOTE — Telephone Encounter (Signed)
Spoke to pt asked her what she is seeing the ophthalmologist for? Pt said early stages of Glaucoma. Told pt okay will place referral and they will send over to Dr. Mitzi Hansen Mincey's office. Pt verbalized understanding.

## 2021-03-30 ENCOUNTER — Telehealth: Payer: Self-pay

## 2021-03-30 NOTE — Telephone Encounter (Signed)
Patient is wanting to know if she is due for a hep c screening?

## 2021-04-03 NOTE — Telephone Encounter (Signed)
Spoke to pt told her yes you are due for Hep C screening. When you come in for a visit we can order it then. Pt verbalized understanding.

## 2021-04-06 ENCOUNTER — Encounter: Payer: Self-pay | Admitting: Obstetrics and Gynecology

## 2021-04-25 ENCOUNTER — Other Ambulatory Visit (HOSPITAL_COMMUNITY): Payer: Self-pay

## 2021-08-06 ENCOUNTER — Encounter: Payer: Self-pay | Admitting: Family Medicine

## 2021-08-08 NOTE — Telephone Encounter (Signed)
Patient returned call, she said that she has skin tags and moles that are needing to be removed and looked at.

## 2021-08-08 NOTE — Telephone Encounter (Signed)
Lvm for pt to call the office to verify the reason for the derm appointment.

## 2021-08-20 ENCOUNTER — Other Ambulatory Visit (HOSPITAL_COMMUNITY): Payer: Self-pay

## 2021-08-20 ENCOUNTER — Ambulatory Visit (INDEPENDENT_AMBULATORY_CARE_PROVIDER_SITE_OTHER): Payer: No Typology Code available for payment source | Admitting: Family Medicine

## 2021-08-20 ENCOUNTER — Encounter: Payer: Self-pay | Admitting: Family Medicine

## 2021-08-20 ENCOUNTER — Other Ambulatory Visit: Payer: Self-pay

## 2021-08-20 VITALS — BP 124/87 | HR 69 | Temp 98.2°F | Ht 61.0 in | Wt 198.2 lb

## 2021-08-20 DIAGNOSIS — L918 Other hypertrophic disorders of the skin: Secondary | ICD-10-CM

## 2021-08-20 DIAGNOSIS — M25562 Pain in left knee: Secondary | ICD-10-CM

## 2021-08-20 MED ORDER — MELOXICAM 15 MG PO TABS
15.0000 mg | ORAL_TABLET | Freq: Every day | ORAL | 0 refills | Status: DC
  Filled 2021-08-20: qty 30, 30d supply, fill #0

## 2021-08-20 NOTE — Progress Notes (Signed)
   Rachael Green is a 55 y.o. female who presents today for an office visit.  Assessment/Plan:  Left Knee Green Concern for possible LCL strain or meniscal tear.  We will start conservative management with ice, compression, and home exercise program.  We will also start meloxicam.  She will let me know if not improving over the next 1 to 2 weeks and we can refer to sports medicine at that time.  Irritated Skin Tags Cryotherapy applied today.  See below procedure.  She tolerated well.    Subjective:  HPI:  CC of the Pt is left knee Green due to injury. About a week ago she had slipped on ice and her left knee became injured. She did not gall onto her knee but landed "akwardly" with her knee bent inwards. She heard a popping sound, and produced a tightness in the front and slightly below the knee, and behind the knee. This has made it difficult for her to go down stairs, and the knee has begun popping when moved certain ways. Green, tenderness and swelling was present on the left side of the left knee. She has had an ACL tear in the past in her left knee and is a former runner.    Additionally, she wishes to have some skin tags removed as they get irritated when she wears a necklace.         Objective:  Physical Exam: BP 124/87   Pulse 69   Temp 98.2 F (36.8 C) (Temporal)   Ht 5\' 1"  (1.549 m)   Wt 198 lb 3.2 oz (89.9 kg)   LMP 07/11/2018   SpO2 98%   BMI 37.45 kg/m   Gen: No acute distress, resting comfortably CV: Regular rate and rhythm with no murmurs appreciated Pulm: Normal work of breathing, clear to auscultation bilaterally with no crackles, wheezes, or rhonchi Skin: Several  inflamed skin tags around neck MSK:  - Left knee: No deformities.  No gross effusions.  Tenderness palpation on the left lateral joint line.  Stable to varus and valgus stress.  Anterior and posterior drawer signs negative.  Positive McMurry test.  Neurovascular intact distally. Neuro: Grossly normal,  moves all extremities Psych: Normal affect and thought content  Cryotherapy Procedure Note  Pre-operative Diagnosis: Inflamed Skin tags  Locations: Bilateral neck  Indications: Therapeutic  Procedure Details  Patient informed of risks (permanent scarring, infection, light or dark discoloration, bleeding, infection, weakness, numbness and recurrence of the lesion) and benefits of the procedure and verbal informed consent obtained.  The areas are treated with liquid nitrogen therapy, frozen until ice ball extended 1 mm beyond lesion, allowed to thawThe patient tolerated procedure well.  The patient was instructed on post-op care, warned that there may be blister formation, redness and Green. Recommend OTC analgesia as needed for Green. Over 25 lesions were treated.  Condition: Stable  Complications: none.       I,Rachael Green,acting as a Education administrator for Rachael Chyle, MD.,have documented all relevant documentation on the behalf of Rachael Chyle, MD,as directed by  Rachael Chyle, MD while in the presence of Rachael Chyle, MD.  I, Rachael Chyle, MD, have reviewed all documentation for this visit. The documentation on 08/20/21 for the exam, diagnosis, procedures, and orders are all accurate and complete.  Rachael Green. Rachael Pain, MD 08/20/2021 1:39 PM

## 2021-08-20 NOTE — Patient Instructions (Addendum)
It was very nice to see you today!  May have a small meniscal tear.  It is also possible you may have strained the ligaments in your leg.  Please work on the exercises.  Take meloxicam.  Use ice and compression as much you can.  Let me know if your symptoms are not improving in the next 1 to 2 weeks.  We froze your skin tags today.   Take care, Dr Jerline Pain  PLEASE NOTE:  If you had any lab tests please let us know if you have not heard back within a few days. You may see your results on mychart before we have a chance to review them but we will give you a call once they are reviewed by [place]. If we ordered any referrals today, please let us know if you have not heard from their office within the next week.   Please try these tips to maintain a healthy lifestyle:  Eat at least 3 REAL meals and 1-2 snacks per day.  Aim for no more than 5 hours between eating.  If you eat breakfast, please do so within one hour of getting up.   Each meal should contain half fruits/vegetables, one quarter protein, and one quarter carbs (no bigger than a computer mouse)  Cut down on sweet beverages. This includes juice, soda, and sweet tea.   Drink at least 1 glass of water with each meal and aim for at least 8 glasses per day  Exercise at least 150 minutes every week.

## 2021-08-22 ENCOUNTER — Encounter: Payer: Self-pay | Admitting: Family Medicine

## 2021-10-02 ENCOUNTER — Other Ambulatory Visit (HOSPITAL_COMMUNITY): Payer: Self-pay

## 2021-11-05 ENCOUNTER — Encounter: Payer: Self-pay | Admitting: Family Medicine

## 2021-11-05 DIAGNOSIS — M25562 Pain in left knee: Secondary | ICD-10-CM

## 2021-11-05 DIAGNOSIS — G8929 Other chronic pain: Secondary | ICD-10-CM

## 2021-11-05 NOTE — Telephone Encounter (Signed)
Please advise 

## 2021-11-06 NOTE — Telephone Encounter (Signed)
CAn we have her schedule an appointment here first? They won't accept a referral without an office visit first.  Algis Greenhouse. Jerline Pain, MD 11/06/2021 4:16 PM

## 2021-11-09 ENCOUNTER — Other Ambulatory Visit: Payer: Self-pay

## 2021-11-09 ENCOUNTER — Other Ambulatory Visit (HOSPITAL_COMMUNITY): Payer: Self-pay

## 2021-11-09 ENCOUNTER — Encounter: Payer: Self-pay | Admitting: Family Medicine

## 2021-11-09 ENCOUNTER — Ambulatory Visit (INDEPENDENT_AMBULATORY_CARE_PROVIDER_SITE_OTHER): Payer: No Typology Code available for payment source | Admitting: Family Medicine

## 2021-11-09 VITALS — BP 124/85 | HR 68 | Temp 98.0°F | Ht 61.0 in | Wt 196.6 lb

## 2021-11-09 DIAGNOSIS — L918 Other hypertrophic disorders of the skin: Secondary | ICD-10-CM | POA: Diagnosis not present

## 2021-11-09 DIAGNOSIS — M25562 Pain in left knee: Secondary | ICD-10-CM

## 2021-11-09 MED ORDER — MELOXICAM 15 MG PO TABS
15.0000 mg | ORAL_TABLET | Freq: Every day | ORAL | 0 refills | Status: DC
  Filled 2021-11-09: qty 30, 30d supply, fill #0

## 2021-11-09 NOTE — Progress Notes (Signed)
° °  Rachael Green is a 56 y.o. female who presents today for an office visit.  Assessment/Plan:  Knee Pain Concern for meniscal tear given her injury a few months ago and mechanical symptoms.  She is already doing ice and compression.  We will start meloxicam.  We will place referral to sports medicine for further evaluation.  Irritated Skin Tags We will refer to dermatology.  Cryotherapy has not been effective in the past.    Subjective:  HPI:  Knee pain came back a couple of weeks ago. Started having some swelling.  She may have had some locking sensations. She had an injury severy months ago but symptoms had improved for several months until recently worsening. No obvious recent precipitating events. She has been taking advil and using compression which has helped.        Objective:  Physical Exam: BP 124/85 (BP Location: Left Arm)    Pulse 68    Temp 98 F (36.7 C) (Temporal)    Ht 5\' 1"  (1.549 m)    Wt 196 lb 9.6 oz (89.2 kg)    LMP 07/11/2018    SpO2 96%    BMI 37.15 kg/m   Gen: No acute distress, resting comfortably MSK: Knees without deformity.  Left knee tenderness to palpation along joint line.  Crepitus noted with active and passive range of motion.  Stable to varus and valgus stress. Neuro: Grossly normal, moves all extremities Psych: Normal affect and thought content      Rachael Green M. Jerline Pain, MD 11/09/2021 11:27 AM

## 2021-11-09 NOTE — Patient Instructions (Signed)
It was very nice to see you today!  We will refer you to see sports medicine for your knee.  I will refer you for your skin tags to dermatology.  Please take the meloxicam.  Please continue with ice and compression.  Take care, Dr Jerline Pain  PLEASE NOTE:  If you had any lab tests please let us know if you have not heard back within a few days. You may see your results on mychart before we have a chance to review them but we will give you a call once they are reviewed by [place]. If we ordered any referrals today, please let us know if you have not heard from their office within the next week.   Please try these tips to maintain a healthy lifestyle:  Eat at least 3 REAL meals and 1-2 snacks per day.  Aim for no more than 5 hours between eating.  If you eat breakfast, please do so within one hour of getting up.   Each meal should contain half fruits/vegetables, one quarter protein, and one quarter carbs (no bigger than a computer mouse)  Cut down on sweet beverages. This includes juice, soda, and sweet tea.   Drink at least 1 glass of water with each meal and aim for at least 8 glasses per day  Exercise at least 150 minutes every week.

## 2021-11-09 NOTE — Telephone Encounter (Signed)
Pt was seen today by Dr. Jerline Pain and Referral was placed.

## 2021-11-28 NOTE — Progress Notes (Signed)
?Rachael Green D.O. ?Rachael Green ?Rachael Green ?Phone: [phone] ?Subjective:   ?I, Rachael Green, am serving as a Education administrator for Dr. Hulan Green. ?This visit occurred during the SARS-CoV-2 public health emergency.  Safety protocols were in place, including screening questions prior to the visit, additional usage of staff PPE, and extensive cleaning of exam room while observing appropriate contact time as indicated for disinfecting solutions.  ? ?I'm seeing this patient by the request  of:  Rachael Barrack, MD ? ?CC: Left knee pain ? ?[id number]  ?Rachael Green is a 56 y.o. female coming in with complaint of left knee pain. [place] in November saw PCP and took antiinflammatory and was fine. Swelling on the lateral side. Will ice and take meloxicam and that helps. Pain on lateral side going under patella. With overuse the pain will keep her up at night. ? ? ? ?  ? ?Past Medical History:  ?Diagnosis Date  ? Allergy   ? Anxiety   ? Depression   ? Diverticulosis   ? GERD (gastroesophageal reflux disease)   ? Hypertension   ? Thyroid disease   ? ?Past Surgical History:  ?Procedure Laterality Date  ? ARTHROSCOPIC REPAIR ACL    ? left  ? WISDOM TOOTH EXTRACTION    ? ?Social History  ? ?Socioeconomic History  ? Marital status: Married  ?  Spouse name: Not on file  ? Number of children: Not on file  ? Years of education: Not on file  ? Highest education level: Not on file  ?Occupational History  ? Not on file  ?Tobacco Use  ? Smoking status: Never  ? Smokeless tobacco: Never  ?Vaping Use  ? Vaping Use: Never used  ?Substance and Sexual Activity  ? Alcohol use: Yes  ?  Alcohol/week: 2.0 standard drinks  ?  Types: 2 Glasses of wine per week  ? Drug use: Never  ? Sexual activity: Yes  ?  Birth control/protection: None, Post-menopausal  ?Other Topics Concern  ? Not on file  ?Social History Narrative  ? Not on file  ? ?Social Determinants of Health  ? ?Financial Resource Strain: Not on file   ?Food Insecurity: Not on file  ?Transportation Needs: Not on file  ?Physical Activity: Not on file  ?Stress: Not on file  ?Social Connections: Not on file  ? ?No Known Allergies ?Family History  ?Problem Relation Age of Onset  ? Depression Mother   ? Kidney cancer Mother   ? Leukemia Maternal Uncle   ? ? ? ? ?Current Outpatient Medications (Respiratory):  ?  azelastine (ASTELIN) 0.1 % nasal spray, Place 2 sprays into both nostrils 2 (two) times daily. ? ?Current Outpatient Medications (Analgesics):  ?  meloxicam (MOBIC) 15 MG tablet, Take 1 tablet (15 mg total) by mouth daily. ? ? ?Current Outpatient Medications (Other):  ?  B Complex Vitamins (VITAMIN B COMPLEX PO), Take by mouth. ?  latanoprost (XALATAN) 0.005 % ophthalmic solution, Place 1 drop into both eyes in the evening ?  Multiple Vitamin (MULTIVITAMIN) capsule, Take 1 capsule by mouth daily. ?  Omega-3 Fatty Acids (FISH OIL PO), Take by mouth. ?  VITAMIN D PO, Take by mouth. ? ? ?Reviewed prior external information including notes and imaging from  ?primary care provider ?As well as notes that were available from care everywhere and other healthcare systems. ? ?Past medical history, social, surgical and family history all reviewed in electronic medical record.  No pertanent information unless  stated regarding to the chief complaint.  ? ?Review of Systems: ? No headache, visual changes, nausea, vomiting, diarrhea, constipation, dizziness, abdominal pain, skin rash, fevers, chills, night sweats, weight loss, swollen lymph nodes, body aches, joint swelling, chest pain, shortness of breath, mood changes. POSITIVE muscle aches ? ?Objective  ?Blood pressure 118/74, pulse 83, height 5\' 1"  (1.549 m), weight 197 lb (89.4 kg), last menstrual period 07/11/2018, SpO2 97 %. ?  ?General: No apparent distress alert and oriented x3 mood and affect normal, dressed appropriately.  ?HEENT: Pupils equal, extraocular movements intact  ?Respiratory: Patient's speak in full  sentences and does not appear short of breath  ?Cardiovascular: No lower extremity edema, non tender, no erythema  ?Gait normal with good balance and coordination.  ?MSK: Left knee does have tenderness to palpation over the medial joint space.  No significant instability.  Negative McMurray's. ? ? ?Limited muscular skeletal ultrasound was performed and interpreted by Rachael Green, M   ?Patient does have what appears to be a cortical irregularity of the tibial plateau.  Seems to be chronic.  Very mild hypoechoic changes over the in the anterior medial aspect of the knee. ?No significant effusion noted but does have some mild narrowing of the patellofemoral joint. ?Impression: Cortical irregularity of the medial tibial plateau but no significant other findings. ?  ?Impression and Recommendations:  ?  ? ?The above documentation has been reviewed and is accurate and complete Rachael Pulley, DO  ? ? ?

## 2021-11-29 ENCOUNTER — Other Ambulatory Visit: Payer: Self-pay

## 2021-11-29 ENCOUNTER — Ambulatory Visit: Payer: No Typology Code available for payment source | Admitting: Family Medicine

## 2021-11-29 ENCOUNTER — Ambulatory Visit (INDEPENDENT_AMBULATORY_CARE_PROVIDER_SITE_OTHER): Payer: No Typology Code available for payment source

## 2021-11-29 ENCOUNTER — Ambulatory Visit: Payer: Self-pay

## 2021-11-29 VITALS — BP 118/74 | HR 83 | Ht 61.0 in | Wt 197.0 lb

## 2021-11-29 DIAGNOSIS — M25562 Pain in left knee: Secondary | ICD-10-CM | POA: Diagnosis not present

## 2021-11-29 DIAGNOSIS — M79671 Pain in right foot: Secondary | ICD-10-CM

## 2021-11-29 NOTE — Patient Instructions (Addendum)
Keep doing vit D ?K2 251mcg daily for 4 weeks ?Ice after any long walking ?Take meloxicam on days that are very active ?See you again in 4-5 weeks ?

## 2021-11-30 ENCOUNTER — Other Ambulatory Visit (HOSPITAL_COMMUNITY): Payer: Self-pay

## 2021-12-01 ENCOUNTER — Encounter: Payer: Self-pay | Admitting: Family Medicine

## 2021-12-01 NOTE — Assessment & Plan Note (Signed)
Left knee is tender to palpation over the medial joint line.  X-rays do show that there is a potential for a chronic tibial plateau fracture.  Seems to be stable at the moment but does have very mild depression on the x-rays.  Discussed with patient that this could have been secondary to multiple years ago or the injury patient did have last year.  Patient does not have significant instability just more pain.  Given home exercises and icing regimen, discussed which activities to do which ones to avoid.  Increase activity slowly and discussed increasing slowly.  Follow-up again in 6 weeks worsening pain consider injection ?

## 2021-12-27 ENCOUNTER — Ambulatory Visit: Payer: No Typology Code available for payment source | Admitting: Family Medicine

## 2021-12-27 NOTE — Progress Notes (Signed)
?Rachael Green D.O. ?[facility] ?[place] ?Phone: [phone] ?Subjective:   ?I, Rachael Green, am serving as a Education administrator for Dr. Hulan Green. ?This visit occurred during the SARS-CoV-2 public health emergency.  Safety protocols were in place, including screening questions prior to the visit, additional usage of staff PPE, and extensive cleaning of exam room while observing appropriate contact time as indicated for disinfecting solutions.  ? ?I'm seeing this patient by the request  of:  Rachael Barrack, MD ? ?CC: left knee pain  ? ?[id number]  ?11/29/2021 ?Left knee is tender to palpation over the medial joint line.  X-rays do show that there is a potential for a chronic tibial plateau fracture.  Seems to be stable at the moment but does have very mild depression on the x-rays.  Discussed with patient that this could have been secondary to multiple years ago or the injury patient did have last year.  Patient does not have significant instability just more pain.  Given home exercises and icing regimen, discussed which activities to do which ones to avoid.  Increase activity slowly and discussed increasing slowly.  Follow-up again in 6 weeks worsening pain consider injection ? ?Updated 01/01/2022 ?Rachael Green is a 56 y.o. female coming in with complaint of left knee pain. Knee has still been bothersome. Last few days its been worse than before. ? ?Xray IMPRESSION: ?1. Prior cruciate ligament repair. ?2. Minimal osteoarthritis. ? ?  ? ?Past Medical History:  ?Diagnosis Date  ? Allergy   ? Anxiety   ? Depression   ? Diverticulosis   ? GERD (gastroesophageal reflux disease)   ? Hypertension   ? Thyroid disease   ? ?Past Surgical History:  ?Procedure Laterality Date  ? ARTHROSCOPIC REPAIR ACL    ? left  ? WISDOM TOOTH EXTRACTION    ? ?Social History  ? ?Socioeconomic History  ? Marital status: Married  ?  Spouse name: Not on file  ? Number of children: Not on file  ? Years of  education: Not on file  ? Highest education level: Not on file  ?Occupational History  ? Not on file  ?Tobacco Use  ? Smoking status: Never  ? Smokeless tobacco: Never  ?Vaping Use  ? Vaping Use: Never used  ?Substance and Sexual Activity  ? Alcohol use: Yes  ?  Alcohol/week: 2.0 standard drinks  ?  Types: 2 Glasses of wine per week  ? Drug use: Never  ? Sexual activity: Yes  ?  Birth control/protection: None, Post-menopausal  ?Other Topics Concern  ? Not on file  ?Social History Narrative  ? Not on file  ? ?Social Determinants of Health  ? ?Financial Resource Strain: Not on file  ?Food Insecurity: Not on file  ?Transportation Needs: Not on file  ?Physical Activity: Not on file  ?Stress: Not on file  ?Social Connections: Not on file  ? ?No Known Allergies ?Family History  ?Problem Relation Age of Onset  ? Depression Mother   ? Kidney cancer Mother   ? Leukemia Maternal Uncle   ? ? ? ? ?Current Outpatient Medications (Respiratory):  ?  azelastine (ASTELIN) 0.1 % nasal spray, Place 2 sprays into both nostrils 2 (two) times daily. ? ?Current Outpatient Medications (Analgesics):  ?  meloxicam (MOBIC) 15 MG tablet, Take 1 tablet (15 mg total) by mouth daily. ? ? ?Current Outpatient Medications (Other):  ?  B Complex Vitamins (VITAMIN B COMPLEX PO), Take by mouth. ?  latanoprost (XALATAN) 0.005 % ophthalmic solution, Place 1 drop into both eyes in the evening ?  Multiple Vitamin (MULTIVITAMIN) capsule, Take 1 capsule by mouth daily. ?  Omega-3 Fatty Acids (FISH OIL PO), Take by mouth. ?  VITAMIN D PO, Take by mouth. ? ? ?Reviewed prior external information including notes and imaging from  ?primary care provider ?As well as notes that were available from care everywhere and other healthcare systems. ? ?Past medical history, social, surgical and family history all reviewed in electronic medical record.  No pertanent information unless stated regarding to the chief complaint.  ? ?Review of Systems: ? No headache, visual  changes, nausea, vomiting, diarrhea, constipation, dizziness, abdominal pain, skin rash, fevers, chills, night sweats, weight loss, swollen lymph nodes, body aches, joint swelling, chest pain, shortness of breath, mood changes. POSITIVE muscle aches ? ?Objective  ?Blood pressure (!) 142/98, pulse 76, height '5\' 1"'$  (1.549 m), weight 201 lb (91.2 kg), last menstrual period 07/11/2018, SpO2 96 %. ?  ?General: No apparent distress alert and oriented x3 mood and affect normal, dressed appropriately.  ?HEENT: Pupils equal, extraocular movements intact  ?Respiratory: Patient's speak in full sentences and does not appear short of breath  ?Cardiovascular: No lower extremity edema, non tender, no erythema  ?Gait normal with good balance and coordination.  ?MSK: Left knee exam does have tenderness to palpation more over the anterior lateral aspect of the knee.  Crepitus noted with the knee.  Trace effusion noted lacking last 10 degrees of flexion. ? ?Limited muscular skeletal ultrasound was performed and interpreted by Rachael Green, M  ?Limited ultrasound shows the patient does have a effusion noted of the patellofemoral joint.  Mild to moderate in nature.  Patient does have some narrowing of the patellofemoral joint.  Lateral meniscus appears to be fairly unremarkable.  Still has some very mild cortical irregularity of the tibial plateau. ?Impression: Mild knee arthritis ? ?After informed written and verbal consent, patient was seated on exam table. Left knee was prepped with alcohol swab and utilizing anterolateral approach, patient's left knee space was injected with 4:1  marcaine 0.5%: Kenalog '40mg'$ /dL. Patient tolerated the procedure well without immediate complications. ? ?  ?Impression and Recommendations:  ?  ? ?The above documentation has been reviewed and is accurate and complete Rachael Pulley, DO ? ? ? ?

## 2021-12-31 ENCOUNTER — Other Ambulatory Visit (HOSPITAL_COMMUNITY): Payer: Self-pay

## 2022-01-01 ENCOUNTER — Ambulatory Visit: Payer: No Typology Code available for payment source | Admitting: Family Medicine

## 2022-01-01 ENCOUNTER — Ambulatory Visit: Payer: Self-pay

## 2022-01-01 VITALS — BP 142/98 | HR 76 | Ht 61.0 in | Wt 201.0 lb

## 2022-01-01 DIAGNOSIS — M25562 Pain in left knee: Secondary | ICD-10-CM | POA: Diagnosis not present

## 2022-01-01 NOTE — Patient Instructions (Addendum)
Injection in knee ?Good to see you! ?Send [place] a message in 2 weeks if we need to get a possible MRI ?See you again in 8 weeks ?

## 2022-01-01 NOTE — Assessment & Plan Note (Signed)
Patient given injection today.  Patient does not have any significant findings on exam.  Patient did have a trace effusion noted and maybe this is potentially contributing to some of the discomfort and pain.  Discussed with patient icing regimen and home exercises, discussed which activities to do which ones to avoid.  Pain seems to be worse probably secondary to some.  We may need to consider advanced imaging.  Would need to rule out potential occult fracture. ?

## 2022-01-13 ENCOUNTER — Encounter: Payer: Self-pay | Admitting: Family Medicine

## 2022-01-31 ENCOUNTER — Other Ambulatory Visit: Payer: Self-pay | Admitting: Family Medicine

## 2022-01-31 DIAGNOSIS — M25562 Pain in left knee: Secondary | ICD-10-CM

## 2022-02-06 ENCOUNTER — Ambulatory Visit: Payer: No Typology Code available for payment source | Admitting: Family Medicine

## 2022-02-06 ENCOUNTER — Other Ambulatory Visit (HOSPITAL_COMMUNITY): Payer: Self-pay

## 2022-02-07 ENCOUNTER — Other Ambulatory Visit (HOSPITAL_COMMUNITY): Payer: Self-pay

## 2022-02-07 MED ORDER — LATANOPROST 0.005 % OP SOLN
OPHTHALMIC | 3 refills | Status: DC
  Filled 2022-02-07: qty 7.5, 75d supply, fill #0
  Filled 2022-05-20: qty 7.5, 75d supply, fill #1
  Filled 2022-09-17: qty 7.5, 75d supply, fill #2
  Filled 2022-12-01: qty 7.5, 75d supply, fill #3

## 2022-02-09 ENCOUNTER — Ambulatory Visit
Admission: RE | Admit: 2022-02-09 | Discharge: 2022-02-09 | Disposition: A | Discharge status: Home / Self Care | Payer: No Typology Code available for payment source | Source: Ambulatory Visit | Attending: Family Medicine | Admitting: Family Medicine

## 2022-02-09 DIAGNOSIS — M25562 Pain in left knee: Secondary | ICD-10-CM

## 2022-02-14 NOTE — Telephone Encounter (Signed)
Ordered referral. Patient notified via [place]

## 2022-02-14 NOTE — Telephone Encounter (Signed)
Disregard last note...entered in error

## 2022-02-19 ENCOUNTER — Ambulatory Visit: Payer: No Typology Code available for payment source | Admitting: Family Medicine

## 2022-04-10 ENCOUNTER — Other Ambulatory Visit (HOSPITAL_COMMUNITY): Payer: Self-pay

## 2022-04-10 ENCOUNTER — Other Ambulatory Visit: Payer: Self-pay | Admitting: Family Medicine

## 2022-04-10 MED ORDER — MELOXICAM 15 MG PO TABS
15.0000 mg | ORAL_TABLET | Freq: Every day | ORAL | 0 refills | Status: DC
  Filled 2022-04-10: qty 30, 30d supply, fill #0

## 2022-04-17 ENCOUNTER — Other Ambulatory Visit (HOSPITAL_COMMUNITY): Payer: Self-pay

## 2022-04-17 ENCOUNTER — Ambulatory Visit (INDEPENDENT_AMBULATORY_CARE_PROVIDER_SITE_OTHER): Payer: No Typology Code available for payment source | Admitting: Family Medicine

## 2022-04-17 ENCOUNTER — Encounter: Payer: Self-pay | Admitting: Family Medicine

## 2022-04-17 VITALS — BP 116/80 | HR 65 | Temp 97.6°F | Ht 61.0 in | Wt 197.0 lb

## 2022-04-17 DIAGNOSIS — G4700 Insomnia, unspecified: Secondary | ICD-10-CM

## 2022-04-17 DIAGNOSIS — F419 Anxiety disorder, unspecified: Secondary | ICD-10-CM

## 2022-04-17 DIAGNOSIS — Z0001 Encounter for general adult medical examination with abnormal findings: Secondary | ICD-10-CM

## 2022-04-17 DIAGNOSIS — F325 Major depressive disorder, single episode, in full remission: Secondary | ICD-10-CM

## 2022-04-17 DIAGNOSIS — R739 Hyperglycemia, unspecified: Secondary | ICD-10-CM

## 2022-04-17 DIAGNOSIS — E785 Hyperlipidemia, unspecified: Secondary | ICD-10-CM

## 2022-04-17 DIAGNOSIS — I10 Essential (primary) hypertension: Secondary | ICD-10-CM

## 2022-04-17 DIAGNOSIS — Z1159 Encounter for screening for other viral diseases: Secondary | ICD-10-CM

## 2022-04-17 DIAGNOSIS — Z1211 Encounter for screening for malignant neoplasm of colon: Secondary | ICD-10-CM

## 2022-04-17 DIAGNOSIS — E039 Hypothyroidism, unspecified: Secondary | ICD-10-CM

## 2022-04-17 DIAGNOSIS — Z23 Encounter for immunization: Secondary | ICD-10-CM

## 2022-04-17 LAB — CBC
HCT: 43.8 % (ref 36.0–46.0)
Hemoglobin: 15 g/dL (ref 12.0–15.0)
MCHC: 34.2 g/dL (ref 30.0–36.0)
MCV: 96.7 fl (ref 78.0–100.0)
Platelets: 277 10*3/uL (ref 150.0–400.0)
RBC: 4.53 Mil/uL (ref 3.87–5.11)
RDW: 13.1 % (ref 11.5–15.5)
WBC: 6.3 10*3/uL (ref 4.0–10.5)

## 2022-04-17 LAB — LIPID PANEL
Cholesterol: 239 mg/dL — ABNORMAL HIGH (ref 0–200)
HDL: 59.5 mg/dL (ref >39.00)
LDL Cholesterol: 150 mg/dL — ABNORMAL HIGH (ref 0–99)
NonHDL: 179.72
Total CHOL/HDL Ratio: 4
Triglycerides: 148 mg/dL (ref 0.0–149.0)
VLDL: 29.6 mg/dL (ref 0.0–40.0)

## 2022-04-17 LAB — COMPREHENSIVE METABOLIC PANEL
ALT: 47 U/L — ABNORMAL HIGH (ref 0–35)
AST: 36 U/L (ref 0–37)
Albumin: 4.4 g/dL (ref 3.5–5.2)
Alkaline Phosphatase: 74 U/L (ref 39–117)
BUN: 10 mg/dL (ref 6–23)
CO2: 27 mEq/L (ref 19–32)
Calcium: 9.6 mg/dL (ref 8.4–10.5)
Chloride: 103 mEq/L (ref 96–112)
Creatinine, Ser: 0.6 mg/dL (ref 0.40–1.20)
GFR: 102.38 mL/min (ref >60.00)
Glucose, Bld: 102 mg/dL — ABNORMAL HIGH (ref 70–99)
Potassium: 4.5 mEq/L (ref 3.5–5.1)
Sodium: 140 mEq/L (ref 135–145)
Total Bilirubin: 0.7 mg/dL (ref 0.2–1.2)
Total Protein: 7.3 g/dL (ref 6.0–8.3)

## 2022-04-17 LAB — TSH: TSH: 2.54 u[IU]/mL (ref 0.35–5.50)

## 2022-04-17 LAB — HEMOGLOBIN A1C: Hgb A1c MFr Bld: 5.6 % (ref 4.6–6.5)

## 2022-04-17 MED ORDER — HYDROXYZINE HCL 25 MG PO TABS
25.0000 mg | ORAL_TABLET | Freq: Every evening | ORAL | 0 refills | Status: DC | PRN
Start: 2022-04-17 — End: 2023-06-19
  Filled 2022-04-17: qty 30, 30d supply, fill #0

## 2022-04-17 NOTE — Assessment & Plan Note (Signed)
Stable off meds. ?

## 2022-04-17 NOTE — Assessment & Plan Note (Addendum)
Not currently on any medications.  Check TSH.

## 2022-04-17 NOTE — Assessment & Plan Note (Signed)
I had a lengthy discussion with patient regarding sleep hygiene measures.  She has not had much improvement with melatonin.  She will try working on sleep hygiene measures.  We will send in low-dose hydroxyzine to use at night as needed.  We discussed potential side effects.  She can follow-up with me in a few weeks via MyChart.

## 2022-04-17 NOTE — Assessment & Plan Note (Signed)
At goal today off medications.

## 2022-04-17 NOTE — Progress Notes (Signed)
Chief Complaint:  Rachael Green is a 56 y.o. female who presents today for her annual comprehensive physical exam.  222  Assessment/Plan:  Chronic Problems Addressed Today: Hyperglycemia Check A1c.   Dyslipidemia Check lipids.   Hypothyroidism Not currently on any medications.  Check TSH.  Insomnia I had a lengthy discussion with patient regarding sleep hygiene measures.  She has not had much improvement with melatonin.  She will try working on sleep hygiene measures.  We will send in low-dose hydroxyzine to use at night as needed.  We discussed potential side effects.  She can follow-up with me in a few weeks via MyChart.  Anxiety Stable off meds.   Depression, major, in remission ([place]) Stable off meds.  Essential hypertension At goal today off medications.  Preventative Healthcare: Check labs.  We will place referral for colonoscopy.  We will get second shingles vaccine today.  Patient Counseling(The following topics were reviewed and/or handout was given):  -Nutrition: Stressed importance of moderation in sodium/caffeine intake, saturated fat and cholesterol, caloric balance, sufficient intake of fresh fruits, vegetables, and fiber.  -Stressed the importance of regular exercise.   -Substance Abuse: Discussed cessation/primary prevention of tobacco, alcohol, or other drug use; driving or other dangerous activities under the influence; availability of treatment for abuse.   -Injury prevention: Discussed safety belts, safety helmets, smoke detector, smoking near bedding or upholstery.   -Sexuality: Discussed sexually transmitted diseases, partner selection, use of condoms, avoidance of unintended pregnancy and contraceptive alternatives.   -Dental health: Discussed importance of regular tooth brushing, flossing, and dental visits.  -Health maintenance and immunizations reviewed. Please refer to Health maintenance section.  Return to care in 1 year for next preventative  visit.     Subjective:  HPI:  She has no acute complaints today.       04/17/2022    8:47 AM  Depression screen PHQ 2/9  Decreased Interest 0  Down, Depressed, Hopeless 0  PHQ - 2 Score 0  Altered sleeping 2  Tired, decreased energy 0  Change in appetite 0  Feeling bad or failure about yourself  0  Trouble concentrating 0  Moving slowly or fidgety/restless 0  Suicidal thoughts 0  PHQ-9 Score 2  Difficult doing work/chores Not difficult at all    Health Maintenance Due  Topic Date Due   Hepatitis C Screening  Never done   Zoster Vaccines- Shingrix (1 of 2) Never done   COLONOSCOPY (Pts 45-11yr Insurance coverage will need to be confirmed)  Never done     ROS: Per HPI, otherwise a complete review of systems was negative.   PMH:  The following were reviewed and entered/updated in epic: Past Medical History:  Diagnosis Date   Allergy    Anxiety    Depression    Diverticulosis    GERD (gastroesophageal reflux disease)    Hypertension    Thyroid disease    Patient Active Problem List   Diagnosis Date Noted   Left knee pain 11/29/2021   Glaucoma 02/07/2021   Hyperglycemia 03/11/2019   Essential hypertension 11/11/2018   Hypothyroidism 11/11/2018   Depression, major, in remission ([place] 11/11/2018   Anxiety 11/11/2018   GERD (gastroesophageal reflux disease) 11/11/2018   Dyslipidemia 11/11/2018   Allergic rhinitis 11/11/2018   Hearing loss 11/11/2018   OSA (obstructive sleep apnea) 11/11/2018   Steatosis of liver 01/09/2018   Lipoma of upper arm 05/10/2016   Insomnia 03/17/2012   Past Surgical History:  Procedure Laterality Date   ARTHROSCOPIC  REPAIR ACL     left   WISDOM TOOTH EXTRACTION      Family History  Problem Relation Age of Onset   Depression Mother    Kidney cancer Mother    Leukemia Maternal Uncle     Medications- reviewed and updated Current Outpatient Medications  Medication Sig Dispense Refill   azelastine (ASTELIN) 0.1 % nasal  spray Place 2 sprays into both nostrils 2 (two) times daily. 30 mL 12   B Complex Vitamins (VITAMIN B COMPLEX PO) Take by mouth.     calcium citrate (CALCITRATE - DOSED IN MG ELEMENTAL CALCIUM) 950 (200 Ca) MG tablet Take 200 mg of elemental calcium by mouth daily.     hydrOXYzine (ATARAX) 25 MG tablet Take 1 tablet (25 mg total) by mouth at bedtime as needed (for insomnia). 30 tablet 0   latanoprost (XALATAN) 0.005 % ophthalmic solution Place 1 drop into both eyes in the evening 7.5 mL 3   meloxicam (MOBIC) 15 MG tablet Take 1 tablet (15 mg total) by mouth daily. 30 tablet 0   Multiple Vitamin (MULTIVITAMIN) capsule Take 1 capsule by mouth daily.     Omega-3 Fatty Acids (FISH OIL PO) Take by mouth.     VITAMIN D PO Take by mouth.     No current facility-administered medications for this visit.    Allergies-reviewed and updated No Known Allergies  Social History   Socioeconomic History   Marital status: Married    Spouse name: Not on file   Number of children: Not on file   Years of education: Not on file   Highest education level: Not on file  Occupational History   Not on file  Tobacco Use   Smoking status: Never   Smokeless tobacco: Never  Vaping Use   Vaping Use: Never used  Substance and Sexual Activity   Alcohol use: Yes    Alcohol/week: 2.0 standard drinks of alcohol    Types: 2 Glasses of wine per week   Drug use: Never   Sexual activity: Yes    Birth control/protection: None, Post-menopausal  Other Topics Concern   Not on file  Social History Narrative   Not on file   Social Determinants of Health   Financial Resource Strain: Not on file  Food Insecurity: Not on file  Transportation Needs: Not on file  Physical Activity: Not on file  Stress: Not on file  Social Connections: Not on file        Objective:  Physical Exam: BP 116/80   Pulse 65   Temp 97.6 F (36.4 C) (Temporal)   Ht '5\' 1"'$  (1.549 m)   Wt 197 lb (89.4 kg)   LMP 05/23/2018   SpO2 96%    BMI 37.22 kg/m   Body mass index is 37.22 kg/m. Wt Readings from Last 3 Encounters:  04/17/22 197 lb (89.4 kg)  01/01/22 201 lb (91.2 kg)  11/29/21 197 lb (89.4 kg)   Gen: NAD, resting comfortably HEENT: TMs normal bilaterally. OP clear. No thyromegaly noted.  CV: RRR with no murmurs appreciated Pulm: NWOB, CTAB with no crackles, wheezes, or rhonchi GI: Normal bowel sounds present. Soft, Nontender, Nondistended. MSK: no edema, cyanosis, or clubbing noted Skin: warm, dry Neuro: CN2-12 grossly intact. Strength 5/5 in upper and lower extremities. Reflexes symmetric and intact bilaterally.  Psych: Normal affect and thought content     Rachael Green M. Jerline Pain, MD 04/17/2022 9:16 AM

## 2022-04-17 NOTE — Patient Instructions (Addendum)
It was very nice to see you today!    Please try to incorporate the following into your daily routine to help with your sleep:  1. Sleep only long enough to feel rested and then get out of bed  2. Go to bed and get up at the same time every day  3. Do not try to force yourself to sleep. If you can't sleep, get out of bed and try again later.  4. Have coffee, tea, and other foods that have caffeine only in the morning  5. Avoid alcohol in the late afternoon, evening, and bedtime  6. Avoid smoking, especially in the evening  7. Keep your bedroom dark, cool, quiet, and free of reminders of work or other things that cause you stress  8. Solve problems you have before you go to bed  9. Exercise several days a week, but not right before bed  10. Avoid looking at phones or reading devices ("e-books") that give off light before bed. This can make it harder to fall asleep.    Please try the hydroxyzine if things are not improving with this.  We will check blood work and give your shingles vaccine.  I will also refer you for a colonoscopy.  I will see back in year for your next physical.  Please come back to see [place] sooner if needed.  Take care, Dr Jerline Pain  PLEASE NOTE:  If you had any lab tests please let us know if you have not heard back within a few days. You may see your results on mychart before we have a chance to review them but we will give you a call once they are reviewed by [place]. If we ordered any referrals today, please let us know if you have not heard from their office within the next week.   Please try these tips to maintain a healthy lifestyle:  Eat at least 3 REAL meals and 1-2 snacks per day.  Aim for no more than 5 hours between eating.  If you eat breakfast, please do so within one hour of getting up.   Each meal should contain half fruits/vegetables, one quarter protein, and one quarter carbs (no bigger than a computer mouse)  Cut down on sweet beverages. This  includes juice, soda, and sweet tea.   Drink at least 1 glass of water with each meal and aim for at least 8 glasses per day  Exercise at least 150 minutes every week.    Preventive Care 46-66 Years Old, Female Preventive care refers to lifestyle choices and visits with your health care provider that can promote health and wellness. Preventive care visits are also called wellness exams. What can I expect for my preventive care visit? Counseling Your health care provider may ask you questions about your: Medical history, including: Past medical problems. Family medical history. Pregnancy history. Current health, including: Menstrual cycle. Method of birth control. Emotional well-being. Home life and relationship well-being. Sexual activity and sexual health. Lifestyle, including: Alcohol, nicotine or tobacco, and drug use. Access to firearms. Diet, exercise, and sleep habits. Work and work Statistician. Sunscreen use. Safety issues such as seatbelt and bike helmet use. Physical exam Your health care provider will check your: Height and weight. These may be used to calculate your BMI (body mass index). BMI is a measurement that tells if you are at a healthy weight. Waist circumference. This measures the distance around your waistline. This measurement also tells if you are at a healthy weight  and may help predict your risk of certain diseases, such as type 2 diabetes and high blood pressure. Heart rate and blood pressure. Body temperature. Skin for abnormal spots. What immunizations do I need?  Vaccines are usually given at various ages, according to a schedule. Your health care provider will recommend vaccines for you based on your age, medical history, and lifestyle or other factors, such as travel or where you work. What tests do I need? Screening Your health care provider may recommend screening tests for certain conditions. This may include: Lipid and cholesterol  levels. Diabetes screening. This is done by checking your blood sugar (glucose) after you have not eaten for a while (fasting). Pelvic exam and Pap test. Hepatitis B test. Hepatitis C test. HIV (human immunodeficiency virus) test. STI (sexually transmitted infection) testing, if you are at risk. Lung cancer screening. Colorectal cancer screening. Mammogram. Talk with your health care provider about when you should start having regular mammograms. This may depend on whether you have a family history of breast cancer. BRCA-related cancer screening. This may be done if you have a family history of breast, ovarian, tubal, or peritoneal cancers. Bone density scan. This is done to screen for osteoporosis. Talk with your health care provider about your test results, treatment options, and if necessary, the need for more tests. Follow these instructions at home: Eating and drinking  Eat a diet that includes fresh fruits and vegetables, whole grains, lean protein, and low-fat dairy products. Take vitamin and mineral supplements as recommended by your health care provider. Do not drink alcohol if: Your health care provider tells you not to drink. You are pregnant, may be pregnant, or are planning to become pregnant. If you drink alcohol: Limit how much you have to 0-1 drink a day. Know how much alcohol is in your drink. In the [place] one drink equals one 12 oz bottle of beer (355 mL), one 5 oz glass of wine (148 mL), or one 1 oz glass of hard liquor (44 mL). Lifestyle Brush your teeth every morning and night with fluoride toothpaste. Floss one time each day. Exercise for at least 30 minutes 5 or more days each week. Do not use any products that contain nicotine or tobacco. These products include cigarettes, chewing tobacco, and vaping devices, such as e-cigarettes. If you need help quitting, ask your health care provider. Do not use drugs. If you are sexually active, practice safe sex. Use a  condom or other form of protection to prevent STIs. If you do not wish to become pregnant, use a form of birth control. If you plan to become pregnant, see your health care provider for a prepregnancy visit. Take aspirin only as told by your health care provider. Make sure that you understand how much to take and what form to take. Work with your health care provider to find out whether it is safe and beneficial for you to take aspirin daily. Find healthy ways to manage stress, such as: Meditation, yoga, or listening to music. Journaling. Talking to a trusted person. Spending time with friends and family. Minimize exposure to UV radiation to reduce your risk of skin cancer. Safety Always wear your seat belt while driving or riding in a vehicle. Do not drive: If you have been drinking alcohol. Do not ride with someone who has been drinking. When you are tired or distracted. While texting. If you have been using any mind-altering substances or drugs. Wear a helmet and other protective equipment during sports  activities. If you have firearms in your house, make sure you follow all gun safety procedures. Seek help if you have been physically or sexually abused. What's next? Visit your health care provider once a year for an annual wellness visit. Ask your health care provider how often you should have your eyes and teeth checked. Stay up to date on all vaccines. This information is not intended to replace advice given to you by your health care provider. Make sure you discuss any questions you have with your health care provider. Document Revised: 03/14/2021 Document Reviewed: 03/14/2021 Elsevier Patient Education  [place].

## 2022-04-17 NOTE — Addendum Note (Signed)
Addended by: Betti Cruz on: 04/17/2022 09:26 AM   Modules accepted: Orders

## 2022-04-17 NOTE — Assessment & Plan Note (Signed)
Check A1c. 

## 2022-04-17 NOTE — Assessment & Plan Note (Signed)
Check lipids 

## 2022-04-18 LAB — HEPATITIS C ANTIBODY: Hepatitis C Ab: NONREACTIVE

## 2022-04-19 NOTE — Progress Notes (Signed)
Please inform patient of the following:  Her cholesterol is borderline elevated but stable.  Everything else is normal.  Do not need to start any other medications at this point.  She should continue to work on diet and exercise and we can recheck this in a year or so.

## 2022-05-05 ENCOUNTER — Encounter: Payer: Self-pay | Admitting: Family Medicine

## 2022-05-14 ENCOUNTER — Encounter: Payer: Self-pay | Admitting: Physician Assistant

## 2022-05-20 ENCOUNTER — Other Ambulatory Visit (HOSPITAL_COMMUNITY): Payer: Self-pay

## 2022-06-04 NOTE — Telephone Encounter (Signed)
Please advise what to do with this referral

## 2022-06-05 ENCOUNTER — Other Ambulatory Visit: Payer: Self-pay | Admitting: *Deleted

## 2022-06-05 DIAGNOSIS — L918 Other hypertrophic disorders of the skin: Secondary | ICD-10-CM

## 2022-06-12 NOTE — Progress Notes (Signed)
06/13/2022 Rachael Green [id number] May 05, 1966  Referring provider: Vivi Barrack, MD Primary GI doctor: Dr. Havery Moros  ASSESSMENT AND PLAN:   History of colonic polyps We have discussed the risks of bleeding, infection, perforation, medication reactions, and remote risk of death associated with colonoscopy. All questions were answered and the patient acknowledges these risk and wishes to proceed.  Diverticulosis of colon without hemorrhage Will call if any symptoms. Add on fiber supplement, avoid NSAIDS, information given  Steatosis of liver --Continue to work on risk factor modification including diet exercise and control of risk factors including blood sugars. - monitor q 6 months.   Patient Care Team: Vivi Barrack, MD as PCP - General (Family Medicine)  HISTORY OF PRESENT ILLNESS: 56 y.o. female with a past medical history of OSA, hypothyroidism, depression/anxiety, hearing loss, GERD, steatosis of liver and others listed below presents for evaluation of colonoscopy.   Had colonoscopy in [place] in 2001 but has not had another one.  Had diverticulitis flare in 2001 in [place] and that is what prompted a colonoscopy, had diverticulosis, per patient 1-2 polyps.  Mom with IBS, colon polyps, and kidney cancer.  Patient denies family history of colon cancer or other gastrointestinal malignancies.  Patient reports GERD but avoids foods that are trigger.  She is on mobic 3 x a week when she plays pickleball.  Patient denies dysphagia, nausea, vomiting, melena.  Patient has a BM every day or up to twice a day. Formed stools, no straining. She states sometimes when she plays pickleball her underwear can have BRB on it twice, states she wipes hard, can have   Patient denies change in bowel habits, constipation, diarrhea. Denies changes in appetite, unintentional weight loss.   She  reports that she has never smoked. She has never used smokeless tobacco. She reports  current alcohol use of about 2.0 standard drinks of alcohol per week. She reports that she does not use drugs.  Current Medications:     Current Outpatient Medications (Respiratory):    azelastine (ASTELIN) 0.1 % nasal spray, Place 2 sprays into both nostrils 2 (two) times daily.  Current Outpatient Medications (Analgesics):    meloxicam (MOBIC) 15 MG tablet, Take 1 tablet (15 mg total) by mouth daily.   Current Outpatient Medications (Other):    B Complex Vitamins (VITAMIN B COMPLEX PO), Take by mouth.   calcium citrate (CALCITRATE - DOSED IN MG ELEMENTAL CALCIUM) 950 (200 Ca) MG tablet, Take 200 mg of elemental calcium by mouth daily.   hydrOXYzine (ATARAX) 25 MG tablet, Take 1 tablet (25 mg total) by mouth at bedtime as needed (for insomnia).   latanoprost (XALATAN) 0.005 % ophthalmic solution, Place 1 drop into both eyes in the evening   Multiple Vitamin (MULTIVITAMIN) capsule, Take 1 capsule by mouth daily.   Omega-3 Fatty Acids (FISH OIL PO), Take by mouth.   VITAMIN D PO, Take by mouth.   Study - [id number] (open label) - bictegravir/emtricitabine/tenofovir alafenamide (BIKTARVY) 50-200-25 mg tablet ([place]), Take 3 capsules by mouth daily.  Medical History:  Past Medical History:  Diagnosis Date   Allergy    Anxiety    Depression    Diverticulosis    GERD (gastroesophageal reflux disease)    Hypertension    Thyroid disease    Allergies: No Known Allergies   Surgical History:  She  has a past surgical history that includes Arthroscopic repair ACL and Wisdom tooth extraction. Family History:  Her family history includes Colon  polyps in her mother; Depression in her mother; Irritable bowel syndrome in her mother; Kidney cancer in her mother; Leukemia in her maternal uncle; Liver cancer in her mother.  REVIEW OF SYSTEMS  : All other systems reviewed and negative except where noted in the History of Present Illness.  PHYSICAL EXAM: BP 118/80   Pulse 65   Ht 5'  1" (1.549 m)   Wt 195 lb (88.5 kg)   LMP 05/23/2018   SpO2 98%   BMI 36.84 kg/m  General:   Pleasant, well developed female in no acute distress Head:   Normocephalic and atraumatic. Eyes:  sclerae anicteric,conjunctive pink  Heart:   regular rate and rhythm Pulm:  Clear anteriorly; no wheezing Abdomen:   Soft, Obese AB, Active bowel sounds. No tenderness . , No organomegaly appreciated. Rectal: Not evaluated Extremities:  Without edema. Msk: Symmetrical without gross deformities. Peripheral pulses intact.  Neurologic:  Alert and  oriented x4;  No focal deficits.  Skin:   Dry and intact without significant lesions or rashes. Psychiatric:  Cooperative. Normal mood and affect.    Vladimir Crofts, PA-C 11:07 AM

## 2022-06-13 ENCOUNTER — Ambulatory Visit: Payer: No Typology Code available for payment source | Admitting: Physician Assistant

## 2022-06-13 ENCOUNTER — Other Ambulatory Visit (HOSPITAL_COMMUNITY): Payer: Self-pay

## 2022-06-13 ENCOUNTER — Encounter: Payer: Self-pay | Admitting: Physician Assistant

## 2022-06-13 VITALS — BP 118/80 | HR 65 | Ht 61.0 in | Wt 195.0 lb

## 2022-06-13 DIAGNOSIS — Z860100 Personal history of colon polyps, unspecified: Secondary | ICD-10-CM

## 2022-06-13 DIAGNOSIS — K5730 Diverticulosis of large intestine without perforation or abscess without bleeding: Secondary | ICD-10-CM | POA: Diagnosis not present

## 2022-06-13 DIAGNOSIS — K760 Fatty (change of) liver, not elsewhere classified: Secondary | ICD-10-CM

## 2022-06-13 MED ORDER — NA SULFATE-K SULFATE-MG SULF 17.5-3.13-1.6 GM/177ML PO SOLN
1.0000 | Freq: Once | ORAL | 0 refills | Status: AC
  Filled 2022-06-13: qty 354, 1d supply, fill #0

## 2022-06-13 NOTE — Progress Notes (Signed)
Agree with assessment and plan as outlined.  

## 2022-06-13 NOTE — Patient Instructions (Addendum)
Diverticulosis Diverticulosis is a condition that develops when small pouches (diverticula) form in the wall of the large intestine (colon). The colon is where water is absorbed and stool (feces) is formed. The pouches form when the inside layer of the colon pushes through weak spots in the outer layers of the colon. You may have a few pouches or many of them. The pouches usually do not cause problems unless they become inflamed or infected. When this happens, the condition is called diverticulitis- this is left lower quadrant pain, diarrhea, fever, chills, nausea or vomiting.  If this occurs please call the office or go to the hospital. Sometimes these patches without inflammation can also have painless bleeding associated with them, if this happens please call the office or go to the hospital. Preventing constipation and increasing fiber can help reduce diverticula and prevent complications. Even if you feel you have a high-fiber diet, suggest getting on Benefiber or Cirtracel 2 times daily. Avoid spicy and acidic foods Avoid fatty foods Limit your intake of coffee, tea, alcohol, and carbonated drinks Work to maintain a healthy weight Keep the head of the bed elevated at least 3 inches with blocks or a wedge pillow if you are having any nighttime symptoms Stay upright for 2 hours after eating Avoid meals and snacks three to four hours before bedtime Metabolic dysfunction associated seatohepatitis  Now the leading cause of liver failure in the [place].  It is normally from such risk factors as obesity, diabetes, insulin resistance, high cholesterol, or metabolic syndrome.  The only definitive therapy is weight loss and exercise.   Suggest walking 20-30 mins daily.  Decreasing carbohydrates, increasing veggies.    Fatty Liver Fatty liver is the accumulation of fat in liver cells. It is also called hepatosteatosis or steatohepatitis. It is normal for your liver to contain some fat. If fat  is more than 5 to 10% of your liver's weight, you have fatty liver.  There are often no symptoms (problems) for years while damage is still occurring. People often learn about their fatty liver when they have medical tests for other reasons. Fat can damage your liver for years or even decades without causing problems. When it becomes severe, it can cause fatigue, weight loss, weakness, and confusion. This makes you more likely to develop more serious liver problems. The liver is the largest organ in the body. It does a lot of work and often gives no warning signs when it is sick until late in a disease. The liver has many important jobs including: Breaking down foods. Storing vitamins, iron, and other minerals. Making proteins. Making bile for food digestion. Breaking down many products including medications, alcohol and some poisons.  PROGNOSIS  Fatty liver may cause no damage or it can lead to an inflammation of the liver. This is, called steatohepatitis.  Over time the liver may become scarred and hardened. This condition is called cirrhosis. Cirrhosis is serious and may lead to liver failure or cancer. NASH is one of the leading causes of cirrhosis. About 10-20% of Americans have fatty liver and a smaller 2-5% has NASH.  TREATMENT  Weight loss, fat restriction, and exercise in overweight patients produces inconsistent results but is worth trying. Good control of diabetes may reduce fatty liver. Eat a balanced, healthy diet. Increase your physical activity. There are no medical or surgical treatments for a fatty liver or NASH, but improving your diet and increasing your exercise may help prevent or reverse some of the damage.  You have been scheduled for a colonoscopy. Please follow written instructions given to you at your visit today.  Please pick up your prep supplies at the pharmacy within the next 1-3 days. If you use inhalers (even only as needed), please bring them with you on the day  of your procedure.  The [facility] providers would like to encourage you to use [facility] to communicate with providers for non-urgent requests or questions.  Due to long hold times on the telephone, sending your provider a message by [facility] may be a faster and more efficient way to get a response.  Please allow 48 business hours for a response.  Please remember that this is for non-urgent requests.   Due to recent changes in healthcare laws, you may see the results of your imaging and laboratory studies on MyChart before your provider has had a chance to review them.  We understand that in some cases there may be results that are confusing or concerning to you. Not all laboratory results come back in the same time frame and the provider may be waiting for multiple results in order to interpret others.  Please give [place] 48 hours in order for your provider to thoroughly review all the results before contacting the office for clarification of your results.

## 2022-06-18 ENCOUNTER — Ambulatory Visit (INDEPENDENT_AMBULATORY_CARE_PROVIDER_SITE_OTHER): Payer: No Typology Code available for payment source

## 2022-06-18 DIAGNOSIS — Z23 Encounter for immunization: Secondary | ICD-10-CM | POA: Diagnosis not present

## 2022-06-18 NOTE — Progress Notes (Signed)
Pt here for shingles vaccine for Dr Jerline Pain. Injection given in right deltoid. Pt tolerated well.

## 2022-06-20 ENCOUNTER — Other Ambulatory Visit (HOSPITAL_COMMUNITY): Payer: Self-pay

## 2022-06-24 ENCOUNTER — Encounter: Payer: Self-pay | Admitting: *Deleted

## 2022-06-25 ENCOUNTER — Encounter: Payer: Self-pay | Admitting: Gastroenterology

## 2022-06-30 ENCOUNTER — Encounter: Payer: Self-pay | Admitting: Gastroenterology

## 2022-07-02 ENCOUNTER — Encounter: Payer: Self-pay | Admitting: Gastroenterology

## 2022-07-02 ENCOUNTER — Ambulatory Visit (AMBULATORY_SURGERY_CENTER): Payer: No Typology Code available for payment source | Admitting: Gastroenterology

## 2022-07-02 VITALS — BP 122/82 | HR 62 | Temp 97.8°F | Resp 15 | Ht 61.0 in | Wt 195.0 lb

## 2022-07-02 DIAGNOSIS — Z86010 Personal history of colonic polyps: Secondary | ICD-10-CM | POA: Diagnosis not present

## 2022-07-02 DIAGNOSIS — Z09 Encounter for follow-up examination after completed treatment for conditions other than malignant neoplasm: Secondary | ICD-10-CM

## 2022-07-02 MED ORDER — SODIUM CHLORIDE 0.9 % IV SOLN: 500.0000 mL | Freq: Once | INTRAVENOUS | Status: DC

## 2022-07-02 NOTE — Progress Notes (Signed)
[facility] History and Physical   Primary Care Physician:  Vivi Barrack, MD   Reason for Procedure:   History of colon polyps  Plan:    colonoscopy     HPI: Rachael Green is a 56 y.o. female  here for colonoscopy surveillance - [place], history of diverticulitis as well. No report available. Patient denies any bowel symptoms at this time. No family history of colon cancer known. Otherwise feels well without any cardiopulmonary symptoms.   I have discussed risks / benefits of anesthesia and endoscopic procedure with Madelin Headings and they wish to proceed with the exams as outlined today.    Past Medical History:  Diagnosis Date   Allergy    Anxiety    Depression    Diverticulosis    GERD (gastroesophageal reflux disease)    Hypertension    Thyroid disease     Past Surgical History:  Procedure Laterality Date   ARTHROSCOPIC REPAIR ACL     left   WISDOM TOOTH EXTRACTION      Prior to Admission medications   Medication Sig Start Date End Date Taking? Authorizing Provider  azelastine (ASTELIN) 0.1 % nasal spray Place 2 sprays into both nostrils 2 (two) times daily. 02/07/21  Yes Vivi Barrack, MD  calcium citrate (CALCITRATE - DOSED IN MG ELEMENTAL CALCIUM) 950 (200 Ca) MG tablet Take 200 mg of elemental calcium by mouth daily.   Yes [provider]  latanoprost (XALATAN) 0.005 % ophthalmic solution Place 1 drop into both eyes in the evening 02/07/22  Yes   Multiple Vitamin (MULTIVITAMIN) capsule Take 1 capsule by mouth daily.   Yes [provider]  Omega-3 Fatty Acids (FISH OIL PO) Take by mouth.   Yes [provider]  VITAMIN D PO Take by mouth.   Yes [provider]  B Complex Vitamins (VITAMIN B COMPLEX PO) Take by mouth.    [provider]  hydrOXYzine (ATARAX) 25 MG tablet Take 1 tablet (25 mg total) by mouth at bedtime as needed (for insomnia). 04/17/22    Vivi Barrack, MD  meloxicam (MOBIC) 15 MG tablet Take 1 tablet (15 mg total) by mouth daily. 04/10/22   Vivi Barrack, MD  Study [phone] (open label) - bictegravir/emtricitabine/tenofovir alafenamide (BIKTARVY) 50-200-25 mg tablet ([facility]) Take 3 capsules by mouth daily.    [provider]    Current Outpatient Medications  Medication Sig Dispense Refill   azelastine (ASTELIN) 0.1 % nasal spray Place 2 sprays into both nostrils 2 (two) times daily. 30 mL 12   calcium citrate (CALCITRATE - DOSED IN MG ELEMENTAL CALCIUM) 950 (200 Ca) MG tablet Take 200 mg of elemental calcium by mouth daily.     latanoprost (XALATAN) 0.005 % ophthalmic solution Place 1 drop into both eyes in the evening 7.5 mL 3   Multiple Vitamin (MULTIVITAMIN) capsule Take 1 capsule by mouth daily.     Omega-3 Fatty Acids (FISH OIL PO) Take by mouth.     VITAMIN D PO Take by mouth.     B Complex Vitamins (VITAMIN B COMPLEX PO) Take by mouth.     hydrOXYzine (ATARAX) 25 MG tablet Take 1 tablet (25 mg total) by mouth at bedtime as needed (for insomnia). 30 tablet 0   meloxicam (MOBIC) 15 MG tablet Take 1 tablet (15 mg total) by mouth daily. 30 tablet 0   Study - [id number] (open label) - bictegravir/emtricitabine/tenofovir alafenamide (  BIKTARVY) 50-200-25 mg tablet ([facility]) Take 3 capsules by mouth daily.     Current Facility-Administered Medications  Medication Dose Route Frequency Provider Last Rate Last Admin   0.9 %  sodium chloride infusion  500 mL Intravenous Once Aayden Cefalu, Carlota Raspberry, MD        Allergies as of 07/02/2022   (No Known Allergies)    Family History  Problem Relation Age of Onset   Depression Mother    Kidney cancer Mother    Liver cancer Mother    Colon polyps Mother    Irritable bowel syndrome Mother    Leukemia Maternal Uncle    Esophageal cancer Neg Hx    Colon cancer Neg Hx     Social History   Socioeconomic History   Marital status: Married    Spouse  name: Not on file   Number of children: Not on file   Years of education: Not on file   Highest education level: Not on file  Occupational History   Not on file  Tobacco Use   Smoking status: Never   Smokeless tobacco: Never  Vaping Use   Vaping Use: Never used  Substance and Sexual Activity   Alcohol use: Yes    Alcohol/week: 2.0 standard drinks of alcohol    Types: 2 Glasses of wine per week    Comment: occ   Drug use: Never   Sexual activity: Yes    Birth control/protection: None, Post-menopausal  Other Topics Concern   Not on file  Social History Narrative   Not on file   Social Determinants of Health   Financial Resource Strain: Not on file  Food Insecurity: Not on file  Transportation Needs: Not on file  Physical Activity: Not on file  Stress: Not on file  Social Connections: Not on file  Intimate Partner Violence: Not on file    Review of Systems: All other review of systems negative except as mentioned in the HPI.  Physical Exam: Vital signs BP 127/75   Pulse 66   Temp 97.8 F (36.6 C)   Ht '5\' 1"'$  (1.549 m)   Wt 195 lb (88.5 kg)   LMP 05/23/2018   SpO2 97%   BMI 36.84 kg/m   General:   Alert,  Well-developed, pleasant and cooperative in NAD Lungs:  Clear throughout to auscultation.   Heart:  Regular rate and rhythm Abdomen:  Soft, nontender and nondistended.   Neuro/Psych:  Alert and cooperative. Normal mood and affect. A and O x 3  Jolly Mango, MD [facility]

## 2022-07-02 NOTE — Progress Notes (Signed)
Report to PACU, RN, vss, BBS= Clear.  

## 2022-07-02 NOTE — Progress Notes (Signed)
Pt's states no medical or surgical changes since previsit or office visit. 

## 2022-07-02 NOTE — Patient Instructions (Signed)
Handout on hemorrhoids and diverticulosis given to patient.  Resume previous diet and continue present medications. Repeat colonoscopy in 10 years for surveillance!   YOU HAD AN ENDOSCOPIC PROCEDURE TODAY AT [facility]:   Refer to the procedure report that was given to you for any specific questions about what was found during the examination.  If the procedure report does not answer your questions, please call your gastroenterologist to clarify.  If you requested that your care partner not be given the details of your procedure findings, then the procedure report has been included in a sealed envelope for you to review at your convenience later.  YOU SHOULD EXPECT: Some feelings of bloating in the abdomen. Passage of more gas than usual.  Walking can help get rid of the air that was put into your GI tract during the procedure and reduce the bloating. If you had a lower endoscopy (such as a colonoscopy or flexible sigmoidoscopy) you may notice spotting of blood in your stool or on the toilet paper. If you underwent a bowel prep for your procedure, you may not have a normal bowel movement for a few days.  Please Note:  You might notice some irritation and congestion in your nose or some drainage.  This is from the oxygen used during your procedure.  There is no need for concern and it should clear up in a day or so.  SYMPTOMS TO REPORT IMMEDIATELY:  Following lower endoscopy (colonoscopy or flexible sigmoidoscopy):  Excessive amounts of blood in the stool  Significant tenderness or worsening of abdominal pains  Swelling of the abdomen that is new, acute  Fever of 100F or higher  For urgent or emergent issues, a gastroenterologist can be reached at any hour by calling [phone]. Do not use MyChart messaging for urgent concerns.    DIET:  We do recommend a small meal at first, but then you may proceed to your regular diet.  Drink plenty of fluids but you should avoid  alcoholic beverages for 24 hours.  ACTIVITY:  You should plan to take it easy for the rest of today and you should NOT DRIVE or use heavy machinery until tomorrow (because of the sedation medicines used during the test).    FOLLOW UP: Our staff will call the number listed on your records the next business day following your procedure.  We will call around 7:15- 8:00 am to check on you and address any questions or concerns that you may have regarding the information given to you following your procedure. If we do not reach you, we will leave a message.     If any biopsies were taken you will be contacted by phone or by letter within the next 1-3 weeks.  Please call us at [phone] if you have not heard about the biopsies in 3 weeks.    SIGNATURES/CONFIDENTIALITY: You and/or your care partner have signed paperwork which will be entered into your electronic medical record.  These signatures attest to the fact that that the information above on your After Visit Summary has been reviewed and is understood.  Full responsibility of the confidentiality of this discharge information lies with you and/or your care-partner.

## 2022-07-02 NOTE — Op Note (Signed)
[facility] Patient Name: Rachael Green Procedure Date: 07/02/2022 7:58 AM MRN: [id number] Endoscopist: Remo Lipps P. Havery Moros , MD Age: 56 Referring MD:  Date of Birth: 1966-05-17 Gender: Female Account #: [id number] Procedure:                Colonoscopy Indications:              High risk colon cancer surveillance: Personal                            history of colonic polyps (history of polyps                            reported 2001), history of diverticulitis Medicines:                Monitored Anesthesia Care Procedure:                Pre-Anesthesia Assessment:                           - Prior to the procedure, a History and Physical                            was performed, and patient medications and                            allergies were reviewed. The patient's tolerance of                            previous anesthesia was also reviewed. The risks                            and benefits of the procedure and the sedation                            options and risks were discussed with the patient.                            All questions were answered, and informed consent                            was obtained. Prior Anticoagulants: The patient has                            taken no previous anticoagulant or antiplatelet                            agents. ASA Grade Assessment: II - A patient with                            mild systemic disease. After reviewing the risks                            and benefits, the patient was deemed in  satisfactory condition to undergo the procedure.                           After obtaining informed consent, the colonoscope                            was passed under direct vision. Throughout the                            procedure, the patient's blood pressure, pulse, and                            oxygen saturations were monitored continuously. The                            Olympus CF-HQ190L  (Serial# [id number]) Colonoscope was                            introduced through the anus and advanced to the the                            cecum, identified by appendiceal orifice and                            ileocecal valve. The colonoscopy was performed                            without difficulty. The patient tolerated the                            procedure well. The quality of the bowel                            preparation was good. The ileocecal valve,                            appendiceal orifice, and rectum were photographed. Scope In: 8:03:32 AM Scope Out: 8:18:04 AM Scope Withdrawal Time: 0 hours 11 minutes 54 seconds  Total Procedure Duration: 0 hours 14 minutes 32 seconds  Findings:                 The perianal and digital rectal examinations were                            normal.                           Multiple small-mouthed diverticula were found in                            the entire colon - mild in right and transverse                            colon, higher burden in sigmoid colon.  Internal hemorrhoids were found during                            retroflexion. The hemorrhoids were small.                           The exam was otherwise without abnormality. Complications:            No immediate complications. Estimated blood loss:                            None. Estimated Blood Loss:     Estimated blood loss: none. Impression:               - Diverticulosis in the entire examined colon.                           - Internal hemorrhoids.                           - The examination was otherwise normal.                           - No polyps Recommendation:           - Patient has a contact number available for                            emergencies. The signs and symptoms of potential                            delayed complications were discussed with the                            patient. Return to normal activities tomorrow.                             Written discharge instructions were provided to the                            patient.                           - Resume previous diet.                           - Continue present medications.                           - Repeat colonoscopy in 10 years Carlota Raspberry. Latania Bascomb, MD 07/02/2022 8:22:53 AM This report has been signed electronically.

## 2022-07-03 ENCOUNTER — Telehealth: Payer: Self-pay

## 2022-07-03 NOTE — Telephone Encounter (Signed)
  Follow up Call-     07/02/2022    7:15 AM  Call back number  Post procedure Call Back phone  # [phone]  Permission to leave phone message Yes     Patient questions:  Do you have a fever, pain , or abdominal swelling? No. Pain Score  0 *  Have you tolerated food without any problems? Yes.    Have you been able to return to your normal activities? Yes.    Do you have any questions about your discharge instructions: Diet   No. Medications  No. Follow up visit  No.  Do you have questions or concerns about your Care? No.  Actions: * If pain score is 4 or above: No action needed, pain <4.

## 2022-07-11 ENCOUNTER — Ambulatory Visit (INDEPENDENT_AMBULATORY_CARE_PROVIDER_SITE_OTHER): Payer: No Typology Code available for payment source

## 2022-07-11 DIAGNOSIS — Z23 Encounter for immunization: Secondary | ICD-10-CM | POA: Diagnosis not present

## 2022-07-11 NOTE — Progress Notes (Signed)
Pt received flu shot injection, in rt deltoid, pt tolerated well.

## 2022-08-05 ENCOUNTER — Other Ambulatory Visit: Payer: Self-pay | Admitting: Family Medicine

## 2022-08-05 ENCOUNTER — Other Ambulatory Visit (HOSPITAL_COMMUNITY): Payer: Self-pay

## 2022-08-05 MED ORDER — AZELASTINE HCL 0.1 % NA SOLN
2.0000 | Freq: Two times a day (BID) | NASAL | 12 refills | Status: DC
  Filled 2022-08-05: qty 30, 25d supply, fill #0
  Filled 2022-11-18 – 2023-06-24 (×3): qty 30, 25d supply, fill #1

## 2022-09-02 ENCOUNTER — Encounter: Payer: Self-pay | Admitting: Family Medicine

## 2022-10-02 NOTE — Telephone Encounter (Signed)
Call patient x 2 unable to take to patient due to bad connection

## 2022-10-08 DIAGNOSIS — H401131 Primary open-angle glaucoma, bilateral, mild stage: Secondary | ICD-10-CM | POA: Diagnosis not present

## 2022-11-19 ENCOUNTER — Other Ambulatory Visit (HOSPITAL_COMMUNITY): Payer: Self-pay

## 2022-11-28 ENCOUNTER — Other Ambulatory Visit (HOSPITAL_COMMUNITY): Payer: Self-pay

## 2022-12-01 ENCOUNTER — Other Ambulatory Visit (HOSPITAL_COMMUNITY): Payer: Self-pay

## 2023-02-25 ENCOUNTER — Ambulatory Visit: Payer: 59 | Admitting: Internal Medicine

## 2023-04-25 ENCOUNTER — Encounter: Payer: No Typology Code available for payment source | Admitting: Family Medicine

## 2023-04-29 ENCOUNTER — Encounter: Payer: 59 | Admitting: Family Medicine

## 2023-05-15 ENCOUNTER — Encounter (INDEPENDENT_AMBULATORY_CARE_PROVIDER_SITE_OTHER): Payer: Self-pay

## 2023-06-19 ENCOUNTER — Ambulatory Visit (INDEPENDENT_AMBULATORY_CARE_PROVIDER_SITE_OTHER): Payer: 59 | Admitting: Family Medicine

## 2023-06-19 ENCOUNTER — Encounter: Payer: Self-pay | Admitting: Family Medicine

## 2023-06-19 ENCOUNTER — Other Ambulatory Visit (HOSPITAL_COMMUNITY): Payer: Self-pay

## 2023-06-19 VITALS — BP 126/84 | HR 64 | Temp 97.5°F | Ht 61.0 in | Wt 184.6 lb

## 2023-06-19 DIAGNOSIS — R739 Hyperglycemia, unspecified: Secondary | ICD-10-CM

## 2023-06-19 DIAGNOSIS — F419 Anxiety disorder, unspecified: Secondary | ICD-10-CM

## 2023-06-19 DIAGNOSIS — I10 Essential (primary) hypertension: Secondary | ICD-10-CM | POA: Diagnosis not present

## 2023-06-19 DIAGNOSIS — G4700 Insomnia, unspecified: Secondary | ICD-10-CM

## 2023-06-19 DIAGNOSIS — Z0001 Encounter for general adult medical examination with abnormal findings: Secondary | ICD-10-CM

## 2023-06-19 DIAGNOSIS — E039 Hypothyroidism, unspecified: Secondary | ICD-10-CM | POA: Diagnosis not present

## 2023-06-19 DIAGNOSIS — E785 Hyperlipidemia, unspecified: Secondary | ICD-10-CM | POA: Diagnosis not present

## 2023-06-19 DIAGNOSIS — F325 Major depressive disorder, single episode, in full remission: Secondary | ICD-10-CM

## 2023-06-19 LAB — COMPREHENSIVE METABOLIC PANEL
ALT: 41 U/L — ABNORMAL HIGH (ref 0–35)
AST: 35 U/L (ref 0–37)
Albumin: 4.4 g/dL (ref 3.5–5.2)
Alkaline Phosphatase: 76 U/L (ref 39–117)
BUN: 11 mg/dL (ref 6–23)
CO2: 29 mEq/L (ref 19–32)
Calcium: 9.9 mg/dL (ref 8.4–10.5)
Chloride: 104 mEq/L (ref 96–112)
Creatinine, Ser: 0.61 mg/dL (ref 0.40–1.20)
GFR: 101.14 mL/min (ref >60.00)
Glucose, Bld: 90 mg/dL (ref 70–99)
Potassium: 4.3 mEq/L (ref 3.5–5.1)
Sodium: 141 mEq/L (ref 135–145)
Total Bilirubin: 0.8 mg/dL (ref 0.2–1.2)
Total Protein: 7.5 g/dL (ref 6.0–8.3)

## 2023-06-19 LAB — LIPID PANEL
Cholesterol: 223 mg/dL — ABNORMAL HIGH (ref 0–200)
HDL: 65.6 mg/dL (ref >39.00)
LDL Cholesterol: 134 mg/dL — ABNORMAL HIGH (ref 0–99)
NonHDL: 157.41
Total CHOL/HDL Ratio: 3
Triglycerides: 116 mg/dL (ref 0.0–149.0)
VLDL: 23.2 mg/dL (ref 0.0–40.0)

## 2023-06-19 LAB — HEMOGLOBIN A1C: Hgb A1c MFr Bld: 5.7 % (ref 4.6–6.5)

## 2023-06-19 LAB — CBC
HCT: 43.8 % (ref 36.0–46.0)
Hemoglobin: 14.6 g/dL (ref 12.0–15.0)
MCHC: 33.4 g/dL (ref 30.0–36.0)
MCV: 95.4 fl (ref 78.0–100.0)
Platelets: 244 10*3/uL (ref 150.0–400.0)
RBC: 4.59 Mil/uL (ref 3.87–5.11)
RDW: 13.3 % (ref 11.5–15.5)
WBC: 6.5 10*3/uL (ref 4.0–10.5)

## 2023-06-19 LAB — TSH: TSH: 2.31 u[IU]/mL (ref 0.35–5.50)

## 2023-06-19 MED ORDER — HYDROXYZINE HCL 10 MG PO TABS
5.0000 mg | ORAL_TABLET | Freq: Every evening | ORAL | 0 refills | Status: AC | PRN
Start: 2023-06-19 — End: ?
  Filled 2023-06-19: qty 30, 30d supply, fill #0

## 2023-06-19 NOTE — Assessment & Plan Note (Signed)
Not currently on any medications.  Check TSH.

## 2023-06-19 NOTE — Assessment & Plan Note (Addendum)
Stable without medications.

## 2023-06-19 NOTE — Assessment & Plan Note (Signed)
At goal today off meds.

## 2023-06-19 NOTE — Patient Instructions (Addendum)
It was very nice to see you today!  We will check blood work today.  You have a ganglion cyst in your right foot.  This is benign.  Let us know if this starts to give you any sort of issue.  I think you probably have a pinched nerve in your left foot.  Let us know if this does not improve over the next few weeks.  I will send in a low-dose of hydroxyzine to help you with the sleep.  Let us know if this does not work for you.  Return in about 1 year (around 06/18/2024) for Annual Physical.   Take care, Dr Jimmey Ralph  PLEASE NOTE:  If you had any lab tests, please let us know if you have not heard back within a few days. You may see your results on mychart before we have a chance to review them but we will give you a call once they are reviewed by [place].   If we ordered any referrals today, please let us know if you have not heard from their office within the next week.   If you had any urgent prescriptions sent in today, please check with the pharmacy within an hour of our visit to make sure the prescription was transmitted appropriately.   Please try these tips to maintain a healthy lifestyle:  Eat at least 3 REAL meals and 1-2 snacks per day.  Aim for no more than 5 hours between eating.  If you eat breakfast, please do so within one hour of getting up.   Each meal should contain half fruits/vegetables, one quarter protein, and one quarter carbs (no bigger than a computer mouse)  Cut down on sweet beverages. This includes juice, soda, and sweet tea.   Drink at least 1 glass of water with each meal and aim for at least 8 glasses per day  Exercise at least 150 minutes every week.    Preventive Care 56-16 Years Old, Female Preventive care refers to lifestyle choices and visits with your health care provider that can promote health and wellness. Preventive care visits are also called wellness exams. What can I expect for my preventive care visit? Counseling Your health care provider may  ask you questions about your: Medical history, including: Past medical problems. Family medical history. Pregnancy history. Current health, including: Menstrual cycle. Method of birth control. Emotional well-being. Home life and relationship well-being. Sexual activity and sexual health. Lifestyle, including: Alcohol, nicotine or tobacco, and drug use. Access to firearms. Diet, exercise, and sleep habits. Work and work Astronomer. Sunscreen use. Safety issues such as seatbelt and bike helmet use. Physical exam Your health care provider will check your: Height and weight. These may be used to calculate your BMI (body mass index). BMI is a measurement that tells if you are at a healthy weight. Waist circumference. This measures the distance around your waistline. This measurement also tells if you are at a healthy weight and may help predict your risk of certain diseases, such as type 2 diabetes and high blood pressure. Heart rate and blood pressure. Body temperature. Skin for abnormal spots. What immunizations do I need?  Vaccines are usually given at various ages, according to a schedule. Your health care provider will recommend vaccines for you based on your age, medical history, and lifestyle or other factors, such as travel or where you work. What tests do I need? Screening Your health care provider may recommend screening tests for certain conditions. This may include: Lipid  and cholesterol levels. Diabetes screening. This is done by checking your blood sugar (glucose) after you have not eaten for a while (fasting). Pelvic exam and Pap test. Hepatitis B test. Hepatitis C test. HIV (human immunodeficiency virus) test. STI (sexually transmitted infection) testing, if you are at risk. Lung cancer screening. Colorectal cancer screening. Mammogram. Talk with your health care provider about when you should start having regular mammograms. This may depend on whether you have a  family history of breast cancer. BRCA-related cancer screening. This may be done if you have a family history of breast, ovarian, tubal, or peritoneal cancers. Bone density scan. This is done to screen for osteoporosis. Talk with your health care provider about your test results, treatment options, and if necessary, the need for more tests. Follow these instructions at home: Eating and drinking  Eat a diet that includes fresh fruits and vegetables, whole grains, lean protein, and low-fat dairy products. Take vitamin and mineral supplements as recommended by your health care provider. Do not drink alcohol if: Your health care provider tells you not to drink. You are pregnant, may be pregnant, or are planning to become pregnant. If you drink alcohol: Limit how much you have to 0-1 drink a day. Know how much alcohol is in your drink. In the [place] one drink equals one 12 oz bottle of beer (355 mL), one 5 oz glass of wine (148 mL), or one 1 oz glass of hard liquor (44 mL). Lifestyle Brush your teeth every morning and night with fluoride toothpaste. Floss one time each day. Exercise for at least 30 minutes 5 or more days each week. Do not use any products that contain nicotine or tobacco. These products include cigarettes, chewing tobacco, and vaping devices, such as e-cigarettes. If you need help quitting, ask your health care provider. Do not use drugs. If you are sexually active, practice safe sex. Use a condom or other form of protection to prevent STIs. If you do not wish to become pregnant, use a form of birth control. If you plan to become pregnant, see your health care provider for a prepregnancy visit. Take aspirin only as told by your health care provider. Make sure that you understand how much to take and what form to take. Work with your health care provider to find out whether it is safe and beneficial for you to take aspirin daily. Find healthy ways to manage stress, such  as: Meditation, yoga, or listening to music. Journaling. Talking to a trusted person. Spending time with friends and family. Minimize exposure to UV radiation to reduce your risk of skin cancer. Safety Always wear your seat belt while driving or riding in a vehicle. Do not drive: If you have been drinking alcohol. Do not ride with someone who has been drinking. When you are tired or distracted. While texting. If you have been using any mind-altering substances or drugs. Wear a helmet and other protective equipment during sports activities. If you have firearms in your house, make sure you follow all gun safety procedures. Seek help if you have been physically or sexually abused. What's next? Visit your health care provider once a year for an annual wellness visit. Ask your health care provider how often you should have your eyes and teeth checked. Stay up to date on all vaccines. This information is not intended to replace advice given to you by your health care provider. Make sure you discuss any questions you have with your health care provider. Document Revised:  03/14/2021 Document Reviewed: 03/14/2021 Elsevier Patient Education  2024 [employer].

## 2023-06-19 NOTE — Assessment & Plan Note (Addendum)
Stable without medications. Marland Kitchen

## 2023-06-19 NOTE — Assessment & Plan Note (Signed)
Check A1c.  She is working on lifestyle modifications.

## 2023-06-19 NOTE — Assessment & Plan Note (Signed)
Symptoms are still not controlled likely due to recent increase stressed from selling her uncle's estate.  Her previous dose of hydroxyzine was too strong and caused significant next-day somnolence.  We will try a much lower dose 5 to 10 mg daily.  She is aware of potential side effects.  She can follow-up with me in a few weeks via MyChart.  If she does not do well with low-dose hydroxyzine would consider trial of trazodone.

## 2023-06-19 NOTE — Progress Notes (Signed)
Chief Complaint:  Rachael Green is a 57 y.o. female who presents today for her annual comprehensive physical exam.    Assessment/Plan:  New/Acute Problems: Ganglion cyst No red flags.  She is currently otherwise asymptomatic.  We did discuss potential aspiration with steroid injection or referral to orthopedics however deferred for now.  She will let us know if she develops any symptoms.  Foot Numbness No red flags.  Normal strength on exam.  No decreased sensation on exam.  She does have positive Tinel sign at left fibular head consistent with possible peroneal nerve compression.  We discussed trial of anti-inflammatories versus referral to sports medicine however she deferred for now.  She would like to continue to monitor at home.  Given that she is only having mild symptoms for now believe this would be reasonable.  We did discuss reasons to return to care including development of any new symptoms such as worsening numbness or tingling, pain, or loss of strength.  She will follow-up with [place] in a couple of weeks.  Chronic Problems Addressed Today: Essential hypertension At goal today off meds.  Hypothyroidism Not currently on any medications.  Check TSH.  Depression, major, in remission (HCC) Stable without medications.  Anxiety Stable without medications. .  Dyslipidemia Check lipids.  She is down 11 pounds since her last visit.  Congratulated patient on weight loss.  She will continue to work on lifestyle modifications.  Hyperglycemia Check A1c.  She is working on lifestyle modifications.  Insomnia Symptoms are still not controlled likely due to recent increase stressed from selling her uncle's estate.  Her previous dose of hydroxyzine was too strong and caused significant next-day somnolence.  We will try a much lower dose 5 to 10 mg daily.  She is aware of potential side effects.  She can follow-up with me in a few weeks via MyChart.  If she does not do well with low-dose  hydroxyzine would consider trial of trazodone.   Preventative Healthcare: Due for mammogram.  Check labs today.  She will get flu vaccine later this year.  Patient Counseling(The following topics were reviewed and/or handout was given):  -Nutrition: Stressed importance of moderation in sodium/caffeine intake, saturated fat and cholesterol, caloric balance, sufficient intake of fresh fruits, vegetables, and fiber.  -Stressed the importance of regular exercise.   -Substance Abuse: Discussed cessation/primary prevention of tobacco, alcohol, or other drug use; driving or other dangerous activities under the influence; availability of treatment for abuse.   -Injury prevention: Discussed safety belts, safety helmets, smoke detector, smoking near bedding or upholstery.   -Sexuality: Discussed sexually transmitted diseases, partner selection, use of condoms, avoidance of unintended pregnancy and contraceptive alternatives.   -Dental health: Discussed importance of regular tooth brushing, flossing, and dental visits.  -Health maintenance and immunizations reviewed. Please refer to Health maintenance section.  Return to care in 1 year for next preventative visit.     Subjective:  HPI:  She has no acute complaints today.   She has had a few additional issues she would like to discuss today.  She has had ongoing issues with insomnia.  This has been a issue in the past.  Recently has been under more stress due to the sudden passing of her uncle and her being the primary person tasked with settling his estate.  She is having trouble with staying asleep at night.  She has been on hydroxyzine in the past.  She did try taking a dose of this recently however felt  like it was too strong and had significant next-day grogginess and somnolence.  She has tried melatonin in the past without much benefit.  She is interested in trying a low-dose of hydroxyzine.  She also has noticed a lump on the outer part of her  right foot recently.  No pain.  No recent injuries.  No treatments tried.  She has also noticed numbness and tingling to her left foot and leg for the last couple of weeks.  No obvious injuries or precipitating events.  She is playing more pickleball.  No treatments tried.  No weakness.  Lifestyle Diet: Balanced. Trying to get more fruits and vegetables.  Exercise: Playing more pickleball.      06/19/2023    9:00 AM  Depression screen PHQ 2/9  Decreased Interest 0  Down, Depressed, Hopeless 0  PHQ - 2 Score 0  Altered sleeping 0  Tired, decreased energy 0  Change in appetite 0  Feeling bad or failure about yourself  0  Trouble concentrating 0  Moving slowly or fidgety/restless 0  Suicidal thoughts 0  PHQ-9 Score 0  Difficult doing work/chores Not difficult at all    Health Maintenance Due  Topic Date Due   MAMMOGRAM  04/07/2023     ROS: Per HPI, otherwise a complete review of systems was negative.   PMH:  The following were reviewed and entered/updated in epic: Past Medical History:  Diagnosis Date   Allergy    Anxiety    Depression    Diverticulosis    GERD (gastroesophageal reflux disease)    Hypertension    Thyroid disease    Patient Active Problem List   Diagnosis Date Noted   Left knee pain 11/29/2021   Glaucoma 02/07/2021   Hyperglycemia 03/11/2019   Essential hypertension 11/11/2018   Hypothyroidism 11/11/2018   Depression, major, in remission (HCC) 11/11/2018   Anxiety 11/11/2018   GERD (gastroesophageal reflux disease) 11/11/2018   Dyslipidemia 11/11/2018   Allergic rhinitis 11/11/2018   Hearing loss 11/11/2018   OSA (obstructive sleep apnea) 11/11/2018   Steatosis of liver 01/09/2018   Lipoma of upper arm 05/10/2016   Insomnia 03/17/2012   Past Surgical History:  Procedure Laterality Date   ARTHROSCOPIC REPAIR ACL     left   WISDOM TOOTH EXTRACTION      Family History  Problem Relation Age of Onset   Depression Mother    Kidney cancer  Mother    Liver cancer Mother    Colon polyps Mother    Irritable bowel syndrome Mother    Leukemia Maternal Uncle    Esophageal cancer Neg Hx    Colon cancer Neg Hx     Medications- reviewed and updated Current Outpatient Medications  Medication Sig Dispense Refill   azelastine (ASTELIN) 0.1 % nasal spray Place 2 sprays into both nostrils 2 (two) times daily. 30 mL 12   B Complex Vitamins (VITAMIN B COMPLEX PO) Take by mouth.     calcium citrate (CALCITRATE - DOSED IN MG ELEMENTAL CALCIUM) 950 (200 Ca) MG tablet Take 200 mg of elemental calcium by mouth daily.     hydrOXYzine (ATARAX) 10 MG tablet Take 0.5-1 tablets (5-10 mg total) by mouth at bedtime as needed. 30 tablet 0   latanoprost (XALATAN) 0.005 % ophthalmic solution Place 1 drop into both eyes in the evening 7.5 mL 3   Multiple Vitamin (MULTIVITAMIN) capsule Take 1 capsule by mouth daily.     Omega-3 Fatty Acids (FISH OIL PO) Take by mouth.  VITAMIN D PO Take by mouth.     No current facility-administered medications for this visit.    Allergies-reviewed and updated No Known Allergies  Social History   Socioeconomic History   Marital status: Married    Spouse name: Not on file   Number of children: Not on file   Years of education: Not on file   Highest education level: Not on file  Occupational History   Not on file  Tobacco Use   Smoking status: Never   Smokeless tobacco: Never  Vaping Use   Vaping status: Never Used  Substance and Sexual Activity   Alcohol use: Yes    Alcohol/week: 2.0 standard drinks of alcohol    Types: 2 Glasses of wine per week    Comment: occ   Drug use: Never   Sexual activity: Yes    Birth control/protection: None, Post-menopausal  Other Topics Concern   Not on file  Social History Narrative   Not on file   Social Determinants of Health   Financial Resource Strain: Not on file  Food Insecurity: Not on file  Transportation Needs: Not on file  Physical Activity: Not on  file  Stress: Not on file  Social Connections: Not on file        Objective:  Physical Exam: BP 126/84   Pulse 64   Temp (!) 97.5 F (36.4 C) (Temporal)   Ht 5\' 1"  (1.549 m)   Wt 184 lb 9.6 oz (83.7 kg)   LMP 05/23/2018   SpO2 98%   BMI 34.88 kg/m   Body mass index is 34.88 kg/m. Wt Readings from Last 3 Encounters:  06/19/23 184 lb 9.6 oz (83.7 kg)  07/02/22 195 lb (88.5 kg)  06/13/22 195 lb (88.5 kg)   Gen: NAD, resting comfortably HEENT: TMs normal bilaterally. OP clear. No thyromegaly noted.  CV: RRR with no murmurs appreciated Pulm: NWOB, CTAB with no crackles, wheezes, or rhonchi GI: Normal bowel sounds present. Soft, Nontender, Nondistended. MSK: no edema, cyanosis, or clubbing noted - Right Foot: Small freely mobile 1 cm cyst on right lateral midfoot consistent with ganglion cyst - Left Foot: No deformities.  Full range of motion throughout.  Sensation light touch intact throughout.  Negative Tinel sign at lateral and medial malleoli - Left Knee: No deformities.  Full range of motion throughout.  Reflexes 2+ and symmetric.  Positive Tinel sign at fibular head that reproduces her symptoms. Skin: warm, dry Neuro: CN2-12 grossly intact. Strength 5/5 in upper and lower extremities. Reflexes symmetric and intact bilaterally.  Psych: Normal affect and thought content     Darcie Mellone M. Jimmey Ralph, MD 06/19/2023 9:35 AM

## 2023-06-19 NOTE — Assessment & Plan Note (Signed)
Check lipids.  She is down 11 pounds since her last visit.  Congratulated patient on weight loss.  She will continue to work on lifestyle modifications.

## 2023-06-20 NOTE — Progress Notes (Signed)
Her labs are all stable.  Cholesterol is better than last year.  Her liver numbers are better than last year as well.  Everything else is normal.  She should keep up the great work with diet and exercise and we can recheck everything in a year or so.

## 2023-06-23 ENCOUNTER — Other Ambulatory Visit (HOSPITAL_COMMUNITY): Payer: Self-pay

## 2023-06-24 ENCOUNTER — Other Ambulatory Visit (HOSPITAL_COMMUNITY): Payer: Self-pay

## 2023-07-24 IMAGING — MR MR KNEE*L* W/O CM
5 of 7 series · 24 of 40 positions shown · non-contrast
Comparison: X-ray knee 11/29/2021.

CLINICAL DATA: Chronic knee pain, negative x-ray.

EXAM:
MRI OF THE LEFT KNEE WITHOUT CONTRAST
TECHNIQUE: Multiplanar, multisequence MR imaging of the knee was performed. No
intravenous contrast was administered.

[Series 3: PD · axial · 4.0mm · 0.59mm/px · z∈[-136,-1]mm · 7 of 28 slices shown (1 of 3)]
[im 1/28]
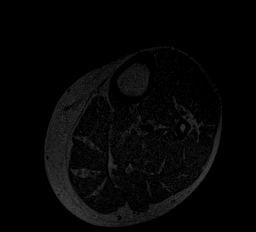
[im 5/28]
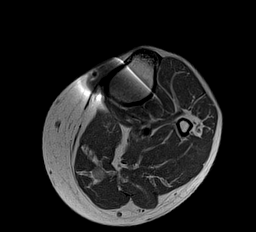
[im 10/28]
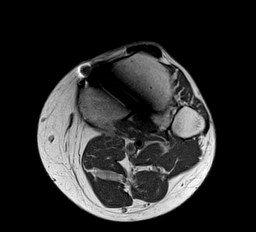
[im 14/28]
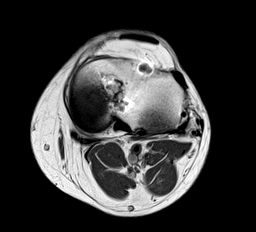
[im 19/28]
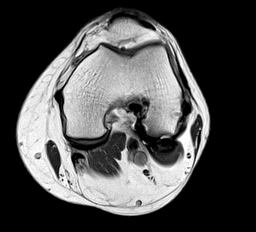
[im 23/28]
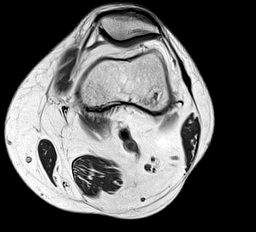
[im 28/28]
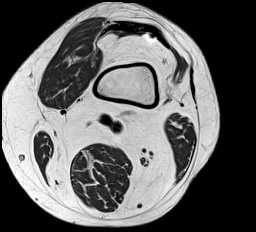

[Series 4: STIR · axial · 4.0mm · 0.29mm/px · 1 of 28 slices shown]
[im 1/28]
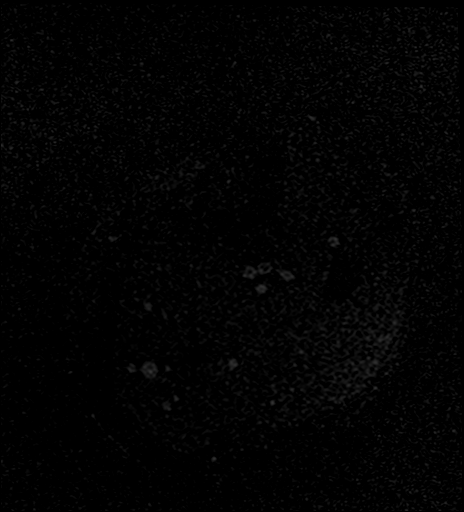

[Series 5: T1 · coronal · 4.0mm · 0.29mm/px · 5 of 22 slices shown]
[im 1/22]
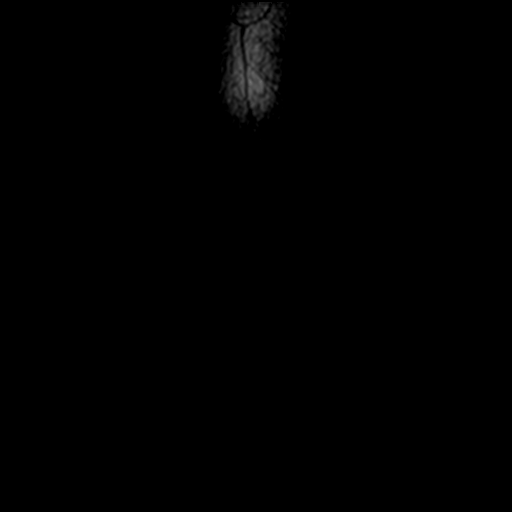
[im 6/22]
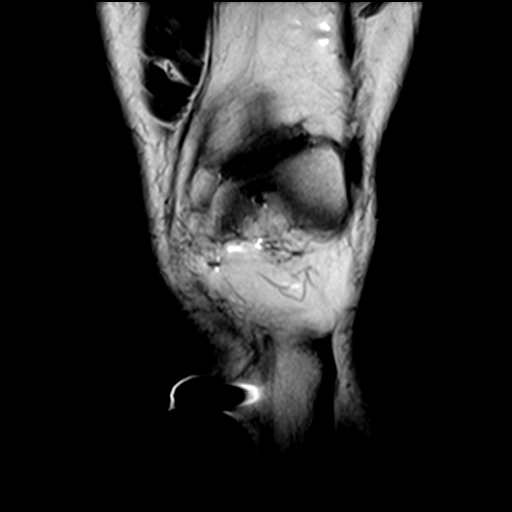
[im 11/22]
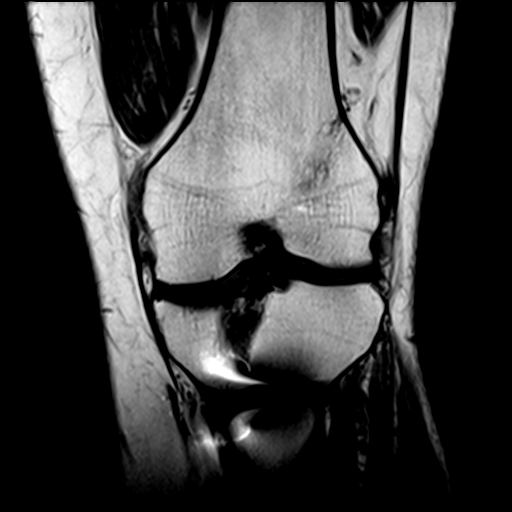
[im 16/22]
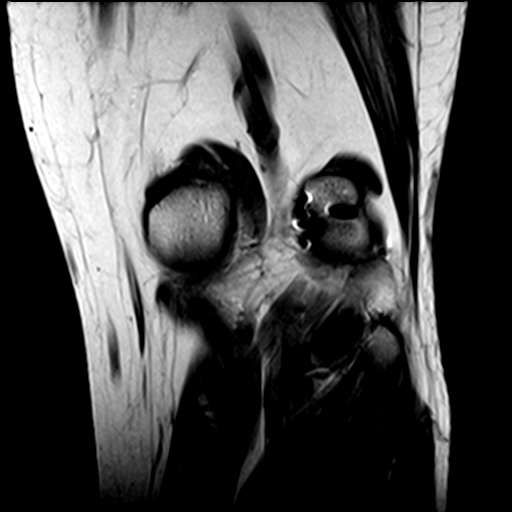
[im 22/22]
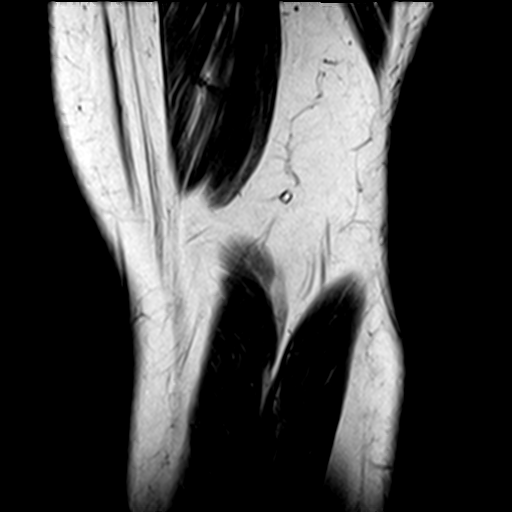

[Series 6: PD · coronal · 4.0mm · 0.29mm/px · 5 of 22 slices shown (2 of 3)]
[im 1/22]
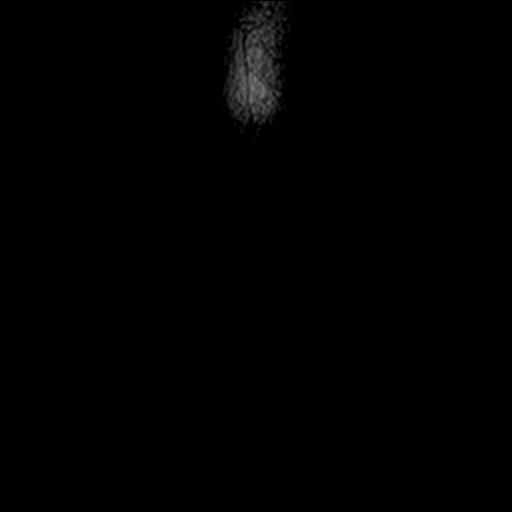
[im 6/22]
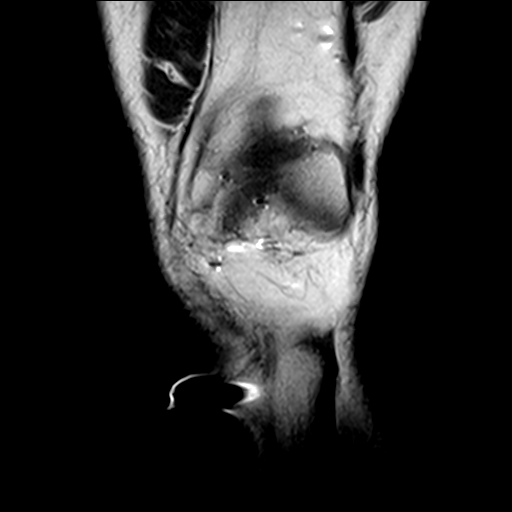
[im 11/22]
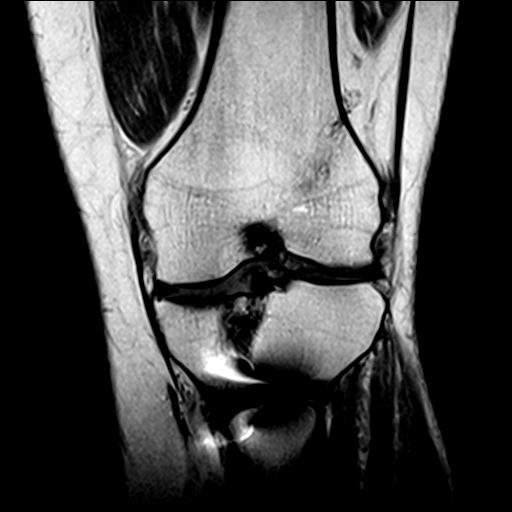
[im 16/22]
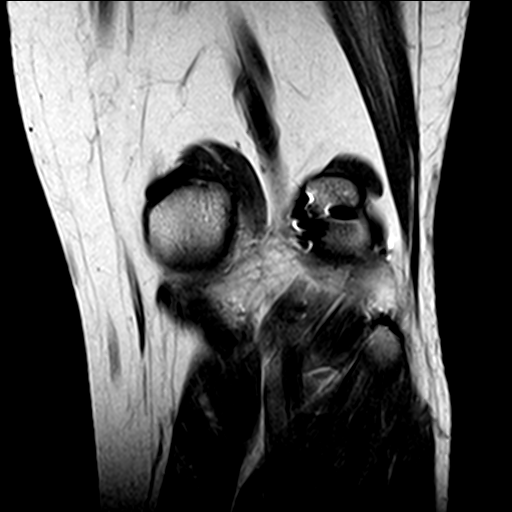
[im 22/22]
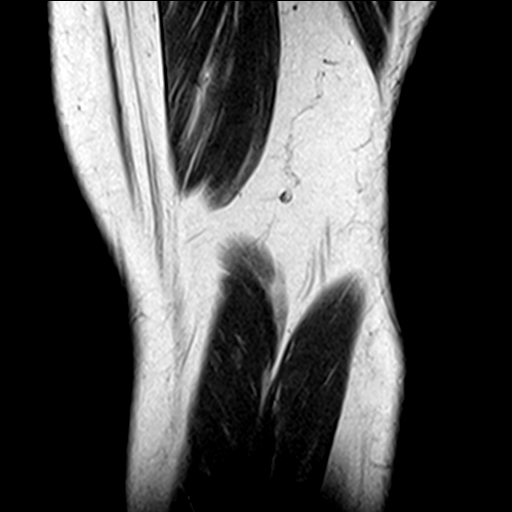

[Series 8: PD · sagittal · 4.0mm · 0.33mm/px · 6 of 25 slices shown (3 of 3)]
[im 1/25]
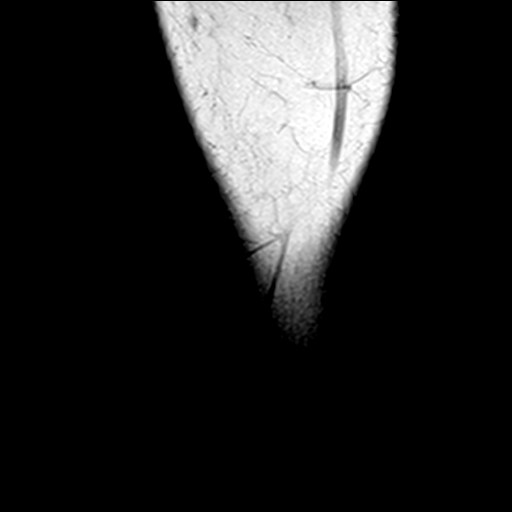
[im 5/25]
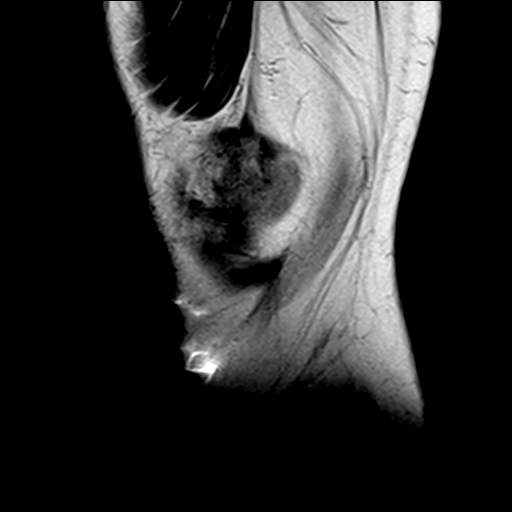
[im 10/25]
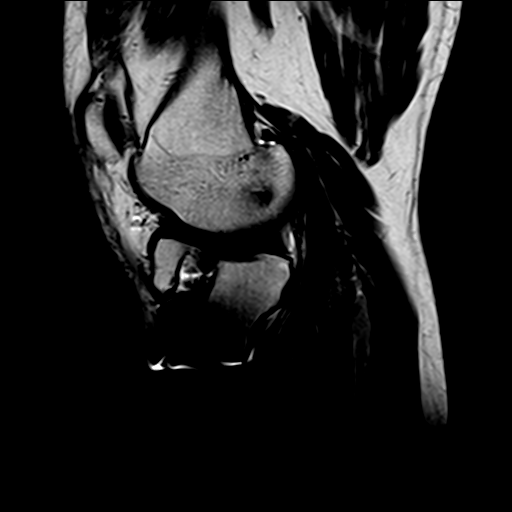
[im 15/25]
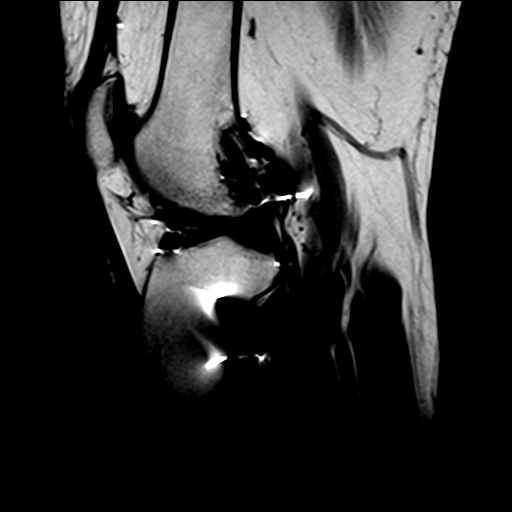
[im 20/25]
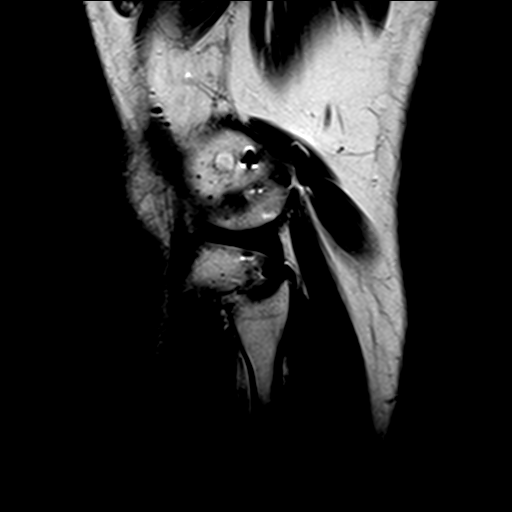
[im 25/25]
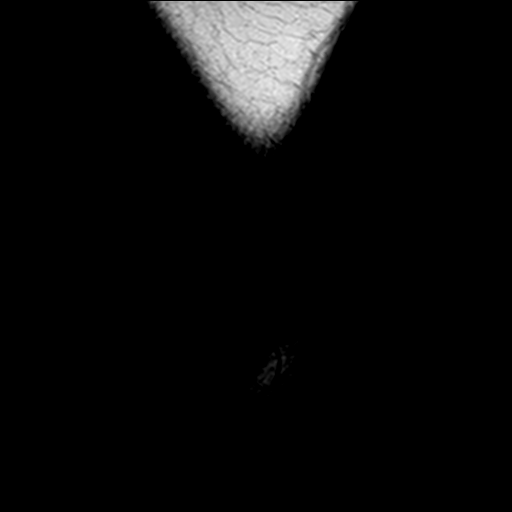

[24 of 40 positions shown; findings below may reference images not displayed]

FINDINGS: MENISCI

Medial: Intact.

Lateral: Intact.

LIGAMENTS

Cruciates: Prior ACL repair. Susceptibility artifact from the
hardware in the tibia limits evaluation for graft loosening.

Collaterals: Medial collateral ligament is intact. Lateral
collateral ligament complex is intact.

CARTILAGE

Patellofemoral:  No chondral defect.

Medial:  No chondral defect.

Lateral:  No chondral defect.

JOINT: No joint effusion. Normal Oneka Zoller. No plical
thickening.

POPLITEAL FOSSA: Popliteus tendon is intact. No Baker's cyst.

EXTENSOR MECHANISM: Intact quadriceps tendon. Intact patellar
tendon. Intact lateral patellar retinaculum. Intact medial patellar
retinaculum. Intact MPFL.

BONES: No aggressive osseous lesion. No fracture or dislocation.
Postsurgical changes from prior ACL graft.

Other: No fluid collection or hematoma. Muscles are normal.
IMPRESSION: 1. Postsurgical changes for prior ACL graft. Susceptibility artifact
from the hardware, limits evaluation, within limitations no evidence
fibrosis or graft abnormality.

2.  Menisci are intact.  Collateral ligaments are intact.

3.  No evidence of joint effusion or significant arthropathy.

4. Marrow signal is within normal limits. No evidence of fracture or
osteonecrosis.

## 2023-07-29 ENCOUNTER — Other Ambulatory Visit: Payer: Self-pay

## 2023-07-29 ENCOUNTER — Ambulatory Visit (INDEPENDENT_AMBULATORY_CARE_PROVIDER_SITE_OTHER): Payer: 59

## 2023-07-29 ENCOUNTER — Ambulatory Visit (INDEPENDENT_AMBULATORY_CARE_PROVIDER_SITE_OTHER): Payer: 59 | Admitting: Family Medicine

## 2023-07-29 VITALS — BP 158/96 | HR 72 | Ht 61.0 in | Wt 187.8 lb

## 2023-07-29 DIAGNOSIS — M79671 Pain in right foot: Secondary | ICD-10-CM

## 2023-07-29 NOTE — Patient Instructions (Addendum)
Thank you for coming in today.   Please complete the exercises that the athletic trainer went over with you:  View at [email or web address]-exercise-[email or web address] using code: [email or web address]  Please use Voltaren gel (Generic Diclofenac Gel) up to 4x daily for pain as needed.  This is available over-the-counter as both the name brand Voltaren gel and the generic diclofenac gel.   I recommend you obtained a compression sleeve to help with your joint problems. There are many options on the market however I recommend obtaining a Full Ankle Body Helix compression sleeve.  You can find information (including how to appropriate measure yourself for sizing) can be found at www.Body [employer].ch.  Many of these products are health savings account (HSA) eligible.   You can use the compression sleeve at any time throughout the day but is most important to use while being active as well as for 2 hours post-activity.   It is appropriate to ice following activity with the compression sleeve in place.   Let me know if not improving and I will order physical therapy

## 2023-07-29 NOTE — Progress Notes (Signed)
   Rubin Payor, PhD, LAT, ATC acting as a scribe for Clementeen Graham, MD.  Rachael Green is a 57 y.o. female who presents to [facility] today for R foot pain. Pt was previously seen by Dr. Katrinka Blazing in 2023 for L knee pain.  Today, pt c/o R foot pain ongoing since Saturday. Pt suffered a fall, tripping on a loose brick on her patio. She fell awkwardly, but thinks her R foot PF. +mechanical symptoms. She locates pain to the talocrural joint and lateral aspect of her R ankle.   R foot swelling: yes Treatments tried: ice, elevation, Tylenol  Pertinent review of systems: No fevers or chills  Relevant historical information: Hypertension and depression.   Exam:  BP (!) 158/96   Pulse 72   Ht 5\' 1"  (1.549 m)   Wt 187 lb 12.8 oz (85.2 kg)   LMP 05/23/2018   SpO2 98%   BMI 35.48 kg/m  General: Well Developed, well nourished, and in no acute distress.   MSK: Right ankle no significant swelling.  Tender palpation anterior lateral ankle.  Normal foot and ankle motion.  Some pain with resisted foot dorsiflexion. Stable ligamentous exam. Intact strength.    Lab and Radiology Results  Diagnostic Limited MSK Ultrasound of: Right anterior foot and ankle Some hypoechoic fluid surrounds the tendon sheath for the shared tendon sheath for the extensor digitorum longus peroneus tertius at the anterior lateral ankle.  The tendon itself appears to be intact. Impression: Tendinitis anterior lateral foot and ankle extensor digitorum longus and peroneus tertius.  X-ray images right foot obtained today personally and independently interpreted. No acute fractures are visible. Mild degeneration medial ankle joint. Await formal radiology review   Assessment and Plan: 57 y.o. female with anterior lateral ankle pain after a plantarflexion injury thought to be strain and tendinitis of the tendons in this area as noted above.  Plan for eccentric exercises taught in clinic  today.  Additionally recommend Voltaren gel and a compressive ankle sleeve.  Consider cam walker boot if needed.  If not improved would recommend physical therapy.  Check back as needed.   PDMP not reviewed this encounter. Orders Placed This Encounter  Procedures   DG Foot Complete Right    Standing Status:   Future    Number of Occurrences:   1    Standing Expiration Date:   07/28/2024    Order Specific Question:   Reason for Exam (SYMPTOM  OR DIAGNOSIS REQUIRED)    Answer:   right foot pain    Order Specific Question:   Preferred imaging location?    Answer:   [facility]    Order Specific Question:   Is patient pregnant?    Answer:   No   [place] LIMITED JOINT SPACE STRUCTURES LOW RIGHT(NO LINKED CHARGES)    Order Specific Question:   Reason for Exam (SYMPTOM  OR DIAGNOSIS REQUIRED)    Answer:   right foot pain    Order Specific Question:   Preferred imaging location?    Answer:   [facility]   No orders of the defined types were placed in this encounter.    Discussed warning signs or symptoms. Please see discharge instructions. Patient expresses understanding.   The above documentation has been reviewed and is accurate and complete Clementeen Graham, M.D.

## 2023-08-05 ENCOUNTER — Other Ambulatory Visit (HOSPITAL_COMMUNITY): Payer: Self-pay

## 2023-08-05 DIAGNOSIS — H401131 Primary open-angle glaucoma, bilateral, mild stage: Secondary | ICD-10-CM | POA: Diagnosis not present

## 2023-08-05 MED ORDER — LATANOPROST 0.005 % OP SOLN
1.0000 [drp] | Freq: Every evening | OPHTHALMIC | 3 refills | Status: DC
  Filled 2023-08-05: qty 7.5, 75d supply, fill #0
  Filled 2023-11-10: qty 7.5, 75d supply, fill #1
  Filled 2024-03-26: qty 7.5, 75d supply, fill #2
  Filled 2024-07-06: qty 7.5, 75d supply, fill #3

## 2023-08-05 MED ORDER — INFLUENZA VIRUS VACC SPLIT PF (FLUZONE) 0.5 ML IM SUSY
0.5000 mL | PREFILLED_SYRINGE | Freq: Once | INTRAMUSCULAR | 0 refills | Status: AC
  Filled 2023-08-05: qty 0.5, 1d supply, fill #0

## 2023-08-22 NOTE — Progress Notes (Signed)
Right foot x-ray looks normal to radiology.  No fractures are present.

## 2023-10-08 ENCOUNTER — Telehealth: Payer: Self-pay | Admitting: Family Medicine

## 2023-10-08 NOTE — Telephone Encounter (Signed)
 Copied from CRM [id number]. Topic: General - Other >> Oct 08, 2023 10:57 AM Maisie BROCKS wrote: Reason for CRM: Pt called and stated that she would like to initiate a TOC from Dr. Kennyth to Dr. Rollene at the [facility] location. Please call and advise at [phone] if pt is approved and can be scheduled.

## 2023-10-08 NOTE — Telephone Encounter (Signed)
 Per our protocols this should be scheduled and message as FYI only.

## 2023-10-12 ENCOUNTER — Ambulatory Visit
Admission: EM | Admit: 2023-10-12 | Discharge: 2023-10-12 | Disposition: A | Discharge status: Home / Self Care | Payer: 59 | Attending: Family Medicine | Admitting: Family Medicine

## 2023-10-12 DIAGNOSIS — J069 Acute upper respiratory infection, unspecified: Secondary | ICD-10-CM | POA: Diagnosis not present

## 2023-10-12 MED ORDER — PROMETHAZINE-DM 6.25-15 MG/5ML PO SYRP
5.0000 mL | ORAL_SOLUTION | Freq: Four times a day (QID) | ORAL | 0 refills | Status: AC | PRN
  Filled 2023-10-12: qty 118, 6d supply, fill #0

## 2023-10-12 MED ORDER — IPRATROPIUM BROMIDE 0.03 % NA SOLN
2.0000 | Freq: Two times a day (BID) | NASAL | 0 refills | Status: DC
  Filled 2023-10-12: qty 30, 25d supply, fill #0

## 2023-10-12 MED ORDER — PSEUDOEPHEDRINE HCL 60 MG PO TABS
60.0000 mg | ORAL_TABLET | Freq: Four times a day (QID) | ORAL | 0 refills | Status: AC | PRN
Start: 2023-10-12 — End: ?
  Filled 2023-10-12: qty 30, 8d supply, fill #0

## 2023-10-12 NOTE — Discharge Instructions (Signed)
 Start Atrovent  nasal spray twice a day as needed for your congestion.  You also take Sudafed every 6 hours as needed for congestion.  Promethazine  DM as needed for cough.  Please note this medication will make you drowsy.  Do not drink alcohol or drive while on this medication.  Lots of rest and fluids.  You may continue Tylenol or ibuprofen as needed.  Please follow-up with your PCP if your symptoms do not improve.  Please go to the ER for any worsening symptoms.  I hope you feel better soon!

## 2023-10-12 NOTE — ED Triage Notes (Signed)
 Pt presents with coughing, sneezing and congestion in chest X 5 days.  States she has a headache and reports the mucus has been turning yellow.

## 2023-10-12 NOTE — ED Provider Notes (Signed)
 [facility]    CSN: [id number] Arrival date & time: 10/12/23  1017      History   Chief Complaint No chief complaint on file.   HPI Rachael Green is a 58 y.o. female  presents for evaluation of URI symptoms for 5 days. Patient reports associated symptoms of cough, congestion, sneezing. Denies N/V/D, fevers, sore throat, ear pain, body aches, shortness of breath. Patient does not have a hx of asthma. Patient is not an active smoker.   Reports mother has similar symptoms.  Pt has taken DayQuil OTC for symptoms. Pt has no other concerns at this time.   HPI  Past Medical History:  Diagnosis Date   Allergy    Anxiety    Depression    Diverticulosis    GERD (gastroesophageal reflux disease)    Hypertension    Thyroid  disease     Patient Active Problem List   Diagnosis Date Noted   Left knee pain 11/29/2021   Glaucoma 02/07/2021   Hyperglycemia 03/11/2019   Essential hypertension 11/11/2018   Hypothyroidism 11/11/2018   Depression, major, in remission (HCC) 11/11/2018   Anxiety 11/11/2018   GERD (gastroesophageal reflux disease) 11/11/2018   Dyslipidemia 11/11/2018   Allergic rhinitis 11/11/2018   Hearing loss 11/11/2018   OSA (obstructive sleep apnea) 11/11/2018   Steatosis of liver 01/09/2018   Lipoma of upper arm 05/10/2016   Insomnia 03/17/2012    Past Surgical History:  Procedure Laterality Date   ARTHROSCOPIC REPAIR ACL     left   WISDOM TOOTH EXTRACTION      OB History     Gravida  0   Para  0   Term  0   Preterm  0   AB  0   Living  0      SAB  0   IAB  0   Ectopic  0   Multiple  0   Live Births  0            Home Medications    Prior to Admission medications   Medication Sig Start Date End Date Taking? Authorizing Provider  ipratropium (ATROVENT ) 0.03 % nasal spray Place 2 sprays into both nostrils every 12 (twelve) hours. 10/12/23  Yes Syd Manges, Jodi R, NP  promethazine -dextromethorphan (PROMETHAZINE -DM) 6.25-15  MG/5ML syrup Take 5 mLs by mouth 4 (four) times daily as needed for cough. 10/12/23  Yes Michelangelo Rindfleisch, Jodi R, NP  pseudoephedrine  (SUDAFED) 60 MG tablet Take 1 tablet (60 mg total) by mouth every 6 (six) hours as needed for congestion. 10/12/23  Yes Ahamed Hofland, Jodi R, NP  B Complex Vitamins (VITAMIN B COMPLEX PO) Take by mouth.    [provider]  calcium  citrate (CALCITRATE - DOSED IN MG ELEMENTAL CALCIUM ) 950 (200 Ca) MG tablet Take 200 mg of elemental calcium  by mouth daily.    [provider]  hydrOXYzine  (ATARAX ) 10 MG tablet Take 0.5-1 tablets (5-10 mg total) by mouth at bedtime as needed. 06/19/23   Kennyth Worth HERO, MD  latanoprost  (XALATAN ) 0.005 % ophthalmic solution Place 1 drop into both eyes every evening. 08/05/23     Multiple Vitamin (MULTIVITAMIN) capsule Take 1 capsule by mouth daily.    [provider]  Omega-3 Fatty Acids (FISH OIL PO) Take by mouth.    [provider]  VITAMIN D  PO Take by mouth.    [provider]    Family History Family History  Problem Relation Age of Onset   Depression Mother  Kidney cancer Mother    Liver cancer Mother    Colon polyps Mother    Irritable bowel syndrome Mother    Leukemia Maternal Uncle    Esophageal cancer Neg Hx    Colon cancer Neg Hx     Social History Social History   Tobacco Use   Smoking status: Never   Smokeless tobacco: Never  Vaping Use   Vaping status: Never Used  Substance Use Topics   Alcohol use: Yes    Alcohol/week: 2.0 standard drinks of alcohol    Types: 2 Glasses of wine per week    Comment: occ   Drug use: Never     Allergies   Patient has no known allergies.   Review of Systems Review of Systems  HENT:  Positive for congestion and sneezing.   Respiratory:  Positive for cough.      Physical Exam Triage Vital Signs ED Triage Vitals [10/12/23 1105]  Encounter Vitals Group     BP 135/88     Systolic BP Percentile      Diastolic BP Percentile      Pulse  Rate 79     Resp 17     Temp 98.4 F (36.9 C)     Temp Source Oral     SpO2 94 %     Weight      Height      Head Circumference      Peak Flow      Pain Score 5     Pain Loc      Pain Education      Exclude from Growth Chart    No data found.  Updated Vital Signs BP 135/88 (BP Location: Right Arm)   Pulse 79   Temp 98.4 F (36.9 C) (Oral)   Resp 17   LMP 05/23/2018   SpO2 94%   Visual Acuity Right Eye Distance:   Left Eye Distance:   Bilateral Distance:    Right Eye Near:   Left Eye Near:    Bilateral Near:     Physical Exam Vitals and nursing note reviewed.  Constitutional:      General: She is not in acute distress.    Appearance: She is well-developed. She is not ill-appearing.  HENT:     Head: Normocephalic and atraumatic.     Right Ear: Tympanic membrane and ear canal normal.     Left Ear: Tympanic membrane and ear canal normal.     Nose: Congestion present.     Mouth/Throat:     Mouth: Mucous membranes are moist.     Pharynx: Oropharynx is clear. Uvula midline. No oropharyngeal exudate or posterior oropharyngeal erythema.     Tonsils: No tonsillar exudate or tonsillar abscesses.  Eyes:     Conjunctiva/sclera: Conjunctivae normal.     Pupils: Pupils are equal, round, and reactive to light.  Cardiovascular:     Rate and Rhythm: Normal rate and regular rhythm.     Heart sounds: Normal heart sounds.  Pulmonary:     Effort: Pulmonary effort is normal.     Breath sounds: Normal breath sounds.  Musculoskeletal:     Cervical back: Normal range of motion and neck supple.  Lymphadenopathy:     Cervical: No cervical adenopathy.  Skin:    General: Skin is warm and dry.  Neurological:     General: No focal deficit present.     Mental Status: She is alert and oriented to person, place, and time.  Psychiatric:  Mood and Affect: Mood normal.        Behavior: Behavior normal.      UC Treatments / Results  Labs (all labs ordered are listed, but  only abnormal results are displayed) Labs Reviewed - No data to display  EKG   Radiology No results found.  Procedures Procedures (including critical care time)  Medications Ordered in UC Medications - No data to display  Initial Impression / Assessment and Plan / UC Course  I have reviewed the triage vital signs and the nursing notes.  Pertinent labs & imaging results that were available during my care of the patient were reviewed by me and considered in my medical decision making (see chart for details).      Reviewed exam and symptoms with patient.  No red flags.  Patient declined COVID testing as she states she had a negative COVID test at home.  Discussed viral illness and symptomatic treatment.  Atrovent  nasal spray as needed for nasal congestion.  Sudafed as needed.  Promethazine  DM.  As well, side effect profile reviewed.  Advise rest fluids and PCP follow-up if symptoms do not improve.  ER precautions reviewed. Final Clinical Impressions(s) / UC Diagnoses   Final diagnoses:  Viral upper respiratory infection     Discharge Instructions      Start Atrovent  nasal spray twice a day as needed for your congestion.  You also take Sudafed every 6 hours as needed for congestion.  Promethazine  DM as needed for cough.  Please note this medication will make you drowsy.  Do not drink alcohol or drive while on this medication.  Lots of rest and fluids.  You may continue Tylenol or ibuprofen as needed.  Please follow-up with your PCP if your symptoms do not improve.  Please go to the ER for any worsening symptoms.  I hope you feel better soon!    ED Prescriptions     Medication Sig Dispense Auth. Provider   ipratropium (ATROVENT ) 0.03 % nasal spray Place 2 sprays into both nostrils every 12 (twelve) hours. 30 mL Shirah Roseman, Jodi R, NP   pseudoephedrine  (SUDAFED) 60 MG tablet Take 1 tablet (60 mg total) by mouth every 6 (six) hours as needed for congestion. 30 tablet Alexys Gassett, Jodi R, NP    promethazine -dextromethorphan (PROMETHAZINE -DM) 6.25-15 MG/5ML syrup Take 5 mLs by mouth 4 (four) times daily as needed for cough. 118 mL Angello Chien, Jodi R, NP      PDMP not reviewed this encounter.   Loreda Myla SAUNDERS, NP 10/12/23 1153

## 2023-10-13 ENCOUNTER — Other Ambulatory Visit: Payer: Self-pay

## 2023-10-13 ENCOUNTER — Other Ambulatory Visit (HOSPITAL_COMMUNITY): Payer: Self-pay

## 2024-03-26 ENCOUNTER — Encounter: Payer: Self-pay | Admitting: Family Medicine

## 2024-03-26 ENCOUNTER — Ambulatory Visit (INDEPENDENT_AMBULATORY_CARE_PROVIDER_SITE_OTHER): Admitting: Family Medicine

## 2024-03-26 ENCOUNTER — Other Ambulatory Visit (HOSPITAL_COMMUNITY): Payer: Self-pay

## 2024-03-26 ENCOUNTER — Other Ambulatory Visit (HOSPITAL_BASED_OUTPATIENT_CLINIC_OR_DEPARTMENT_OTHER): Payer: Self-pay

## 2024-03-26 VITALS — BP 132/81 | HR 68 | Temp 97.9°F | Ht 61.0 in | Wt 194.0 lb

## 2024-03-26 DIAGNOSIS — I10 Essential (primary) hypertension: Secondary | ICD-10-CM | POA: Diagnosis not present

## 2024-03-26 DIAGNOSIS — J309 Allergic rhinitis, unspecified: Secondary | ICD-10-CM

## 2024-03-26 DIAGNOSIS — J069 Acute upper respiratory infection, unspecified: Secondary | ICD-10-CM

## 2024-03-26 DIAGNOSIS — M5412 Radiculopathy, cervical region: Secondary | ICD-10-CM

## 2024-03-26 MED ORDER — PREDNISONE 50 MG PO TABS
ORAL_TABLET | ORAL | 0 refills | Status: AC
  Filled 2024-03-26: qty 5, 5d supply, fill #0

## 2024-03-26 MED ORDER — CYCLOBENZAPRINE HCL 10 MG PO TABS
10.0000 mg | ORAL_TABLET | Freq: Three times a day (TID) | ORAL | 0 refills | Status: AC | PRN
  Filled 2024-03-26: qty 30, 10d supply, fill #0

## 2024-03-26 MED ORDER — GABAPENTIN 100 MG PO CAPS
100.0000 mg | ORAL_CAPSULE | Freq: Every day | ORAL | 0 refills | Status: AC
  Filled 2024-03-26: qty 30, 30d supply, fill #0

## 2024-03-26 MED ORDER — METHYLPREDNISOLONE ACETATE 80 MG/ML IJ SUSP: 80.0000 mg | Freq: Once | INTRAMUSCULAR | Status: DC

## 2024-03-26 MED ORDER — METHYLPREDNISOLONE ACETATE 40 MG/ML IJ SUSP
40.0000 mg | Freq: Once | INTRAMUSCULAR | Status: AC
  Administered 2024-03-26: 80 mg via INTRAMUSCULAR

## 2024-03-26 MED ORDER — IPRATROPIUM BROMIDE 0.03 % NA SOLN
2.0000 | Freq: Two times a day (BID) | NASAL | 0 refills | Status: DC
  Filled 2024-03-26: qty 30, 75d supply, fill #0

## 2024-03-26 NOTE — Assessment & Plan Note (Signed)
 Stable on atrovent . Will refill today.

## 2024-03-26 NOTE — Patient Instructions (Addendum)
 It was very nice to see you today!  You have a pinched nerve in your arm.  Will give you a steroid injection today.  Start prednisone by mouth today as well.  Start the gabapentin and Flexeril at night.  I will refer you to see the sports medicine doctors as well.  Please follow-up with me in a couple of days and let us  know how you are doing.  Return if symptoms worsen or fail to improve.   Take care, Dr Kennyth  PLEASE NOTE:  If you had any lab tests, please let us  know if you have not heard back within a few days. You may see your results on mychart before we have a chance to review them but we will give you a call once they are reviewed by us .   If we ordered any referrals today, please let us  know if you have not heard from their office within the next week.   If you had any urgent prescriptions sent in today, please check with the pharmacy within an hour of our visit to make sure the prescription was transmitted appropriately.   Please try these tips to maintain a healthy lifestyle:  Eat at least 3 REAL meals and 1-2 snacks per day.  Aim for no more than 5 hours between eating.  If you eat breakfast, please do so within one hour of getting up.   Each meal should contain half fruits/vegetables, one quarter protein, and one quarter carbs (no bigger than a computer mouse)  Cut down on sweet beverages. This includes juice, soda, and sweet tea.   Drink at least 1 glass of water with each meal and aim for at least 8 glasses per day  Exercise at least 150 minutes every week.

## 2024-03-26 NOTE — Progress Notes (Signed)
   Rachael Green is a 58 y.o. female who presents today for an office visit.  Assessment/Plan:  New/Acute Problems: Cervical radiculopathy Exam and history consistent with cervical radiculopathy.  No red flag signs or symptoms.  Her symptoms are moderate to severe at this point and significantly inhibiting her ability to perform ADLs.  We did discuss treatment options.  Discussed that we would normally treat with anti-inflammatories however given her severity of symptoms it would be reasonable to be more aggressive at this point. Will give 80 mg of Depo-Medrol today and start prednisone by mouth 50 mg daily for the next 5 days.  Also start gabapentin 100 mg nightly and Flexeril 10 mg 3 times daily as needed.  Will refer for her to see sports medicine though she can also call to schedule appointment as she was seen there recently.  She will avoid any activities that worsen the pain.  She will follow-up with me next week though will defer further management to sports medicine if not improved with above.  Chronic Problems Addressed Today: Essential hypertension At goal today without medications.  Allergic rhinitis Stable on atrovent . Will refill today.      Subjective:  HPI:  See Assessment / plan for status of chronic conditions.  Patient here today with left arm pain and tingling for the last week or so. This intiailly started as a soreness in her left upper back and shoulder then progressed to involve most of her left arm. Sometimes gets a tingling sensation. She plays pickleball but is not sure if she did anything to injure her arm. Symptoms are getting worse. Tried tylenol and advil with some improvement.  Pain is limiting her ability to perform ADLs.       Objective:  Physical Exam: BP 132/81   Pulse 68   Temp 97.9 F (36.6 C) (Temporal)   Ht 5' 1 (1.549 m)   Wt 194 lb (88 kg)   LMP 05/23/2018   SpO2 97%   BMI 36.66 kg/m   Gen: No acute distress, resting  comfortably MUSCULOSKELETAL: - Left Arm: No deformities.  Sensation light touch intact throughout.  Strength intact throughout.  Positive Tinel's sign at medial wrist and medial epicondyles. - Neck: No deformities.  Nontender to palpation. - Back: No deformities.  Tender to palpation over left upper thoracic and lower cervical paraspinal muscles. Neuro: Grossly normal, moves all extremities Psych: Normal affect and thought content      Chyenne Sobczak M. Kennyth, MD 03/26/2024 11:15 AM

## 2024-03-26 NOTE — Assessment & Plan Note (Signed)
At goal today without medications. 

## 2024-03-29 ENCOUNTER — Ambulatory Visit (INDEPENDENT_AMBULATORY_CARE_PROVIDER_SITE_OTHER)

## 2024-03-29 ENCOUNTER — Ambulatory Visit (INDEPENDENT_AMBULATORY_CARE_PROVIDER_SITE_OTHER): Admitting: Family Medicine

## 2024-03-29 ENCOUNTER — Other Ambulatory Visit: Payer: Self-pay

## 2024-03-29 VITALS — BP 122/70 | HR 64 | Ht 61.0 in | Wt 192.8 lb

## 2024-03-29 DIAGNOSIS — M542 Cervicalgia: Secondary | ICD-10-CM | POA: Diagnosis not present

## 2024-03-29 DIAGNOSIS — M47812 Spondylosis without myelopathy or radiculopathy, cervical region: Secondary | ICD-10-CM | POA: Diagnosis not present

## 2024-03-29 DIAGNOSIS — M25512 Pain in left shoulder: Secondary | ICD-10-CM | POA: Diagnosis not present

## 2024-03-29 DIAGNOSIS — M5412 Radiculopathy, cervical region: Secondary | ICD-10-CM | POA: Insufficient documentation

## 2024-03-29 NOTE — Assessment & Plan Note (Signed)
 Left-sided cervical radiculopathy.  Discussed icing regimen and home exercises.  I am concerned of the radicular symptoms in the Toradol and Depo-Medrol given.  With the weakness in the hand and patient is positive Spurling's do think further evaluation with an MRI is necessary.  Patient even has atrophy of the forearm noted and patient is a left-hand-dominant individual.  Discussed with patient about icing regimen and home exercises.  Discussed which activities to do and which ones to avoid.  Increase activity slowly.  Depending on findings she could be a candidate for epidural.  Patient will follow-up with me again after imaging to discuss different treatment option

## 2024-03-29 NOTE — Patient Instructions (Addendum)
Xray today  [facility] [phone] Call Today  When we receive your results we will contact you.  Marland Kitchen

## 2024-03-29 NOTE — Progress Notes (Signed)
 [facility] [address] [place] [address] Phone: [phone] Subjective:   Rachael Green am a scribe for Dr. Claudene.   I'm seeing this patient by the request  of:  Kennyth Worth HERO, MD  CC: shoulder and neck pain   YEP:Dlagzrupcz  Rachael Green is a 58 y.o. female coming in with complaint of was seen by primary care 3 days ago.  Given 80 mg of Depo-Medrol started on prednisone.  Patient states medication has helped some but it is still uncomfortable and tingling. When the pain started it was from back of neck/shouler down to the triceps and settled in her forearm. Consistent pain that just isn't going away. Pain wakes patient up at night and struggles to stay asleep. Props arm up during sleep while lying on her back.   Onset- Friday before last (left shoulder) ROM- no problem with it does hurt      Past Medical History:  Diagnosis Date   Allergy    Anxiety    Depression    Diverticulosis    GERD (gastroesophageal reflux disease)    Hypertension    Thyroid  disease    Past Surgical History:  Procedure Laterality Date   ARTHROSCOPIC REPAIR ACL     left   WISDOM TOOTH EXTRACTION     Social History   Socioeconomic History   Marital status: Married    Spouse name: Not on file   Number of children: Not on file   Years of education: Not on file   Highest education level: Not on file  Occupational History   Not on file  Tobacco Use   Smoking status: Never   Smokeless tobacco: Never  Vaping Use   Vaping status: Never Used  Substance and Sexual Activity   Alcohol use: Yes    Alcohol/week: 2.0 standard drinks of alcohol    Types: 2 Glasses of wine per week    Comment: occ   Drug use: Never   Sexual activity: Yes    Birth control/protection: None, Post-menopausal  Other Topics Concern   Not on file  Social History Narrative   Not on file   Social Drivers of Health   Financial Resource Strain: Not on file  Food  Insecurity: Not on file  Transportation Needs: Not on file  Physical Activity: Not on file  Stress: Not on file  Social Connections: Not on file   No Known Allergies Family History  Problem Relation Age of Onset   Depression Mother    Kidney cancer Mother    Liver cancer Mother    Colon polyps Mother    Irritable bowel syndrome Mother    Leukemia Maternal Uncle    Esophageal cancer Neg Hx    Colon cancer Neg Hx     Current Outpatient Medications (Endocrine & Metabolic):    predniSONE (DELTASONE) 50 MG tablet, Take 1 tablet daily for 5 days.   Current Outpatient Medications (Respiratory):    ipratropium (ATROVENT ) 0.03 % nasal spray, Place 2 sprays into both nostrils every 12 (twelve) hours.   promethazine -dextromethorphan (PROMETHAZINE -DM) 6.25-15 MG/5ML syrup, Take 5 mLs by mouth 4 (four) times daily as needed for cough.   pseudoephedrine  (SUDAFED) 60 MG tablet, Take 1 tablet (60 mg total) by mouth every 6 (six) hours as needed for congestion.    Current Outpatient Medications (Other):    B Complex Vitamins (VITAMIN B COMPLEX PO), Take by mouth.   calcium  citrate (CALCITRATE - DOSED IN MG ELEMENTAL  CALCIUM ) 950 (200 Ca) MG tablet, Take 200 mg of elemental calcium  by mouth daily.   cyclobenzaprine (FLEXERIL) 10 MG tablet, Take 1 tablet (10 mg total) by mouth 3 (three) times daily as needed for muscle spasms.   gabapentin (NEURONTIN) 100 MG capsule, Take 1 capsule (100 mg total) by mouth at bedtime.   hydrOXYzine  (ATARAX ) 10 MG tablet, Take 0.5-1 tablets (5-10 mg total) by mouth at bedtime as needed.   latanoprost  (XALATAN ) 0.005 % ophthalmic solution, Place 1 drop into both eyes every evening.   Multiple Vitamin (MULTIVITAMIN) capsule, Take 1 capsule by mouth daily.   Omega-3 Fatty Acids (FISH OIL PO), Take by mouth.   VITAMIN D  PO, Take by mouth.   Reviewed prior external information including notes and imaging from  primary care provider As well as notes that were  available from care everywhere and other healthcare systems.  Past medical history, social, surgical and family history all reviewed in electronic medical record.  No pertanent information unless stated regarding to the chief complaint.   Review of Systems:  No headache, visual changes, nausea, vomiting, diarrhea, constipation, dizziness, abdominal pain, skin rash, fevers, chills, night sweats, weight loss, swollen lymph nodes, body aches, joint swelling, chest pain, shortness of breath, mood changes. POSITIVE muscle aches  Objective  Blood pressure 122/70, pulse 64, height 5' 1 (1.549 m), weight 192 lb 12.8 oz (87.5 kg), last menstrual period 05/23/2018, SpO2 99%.   General: No apparent distress alert and oriented x3 mood and affect normal, dressed appropriately.  HEENT: Pupils equal, extraocular movements intact  Respiratory: Patient's speak in full sentences and does not appear short of breath  Cardiovascular: No lower extremity edema, non tender, no erythema  Left arm has relatively good range of motion.  Fortunately significantly positive Spurling's with weakness noted in the C7 and C8 distribution the left side.  Patient had significant voluntary guarding of the neck so further relation was mentioned was deferred.  Mild atrophy of the forearm noted.    Impression and Recommendations:    The above documentation has been reviewed and is accurate and complete Allegra Cerniglia M Bertine Schlottman, DO

## 2024-03-30 ENCOUNTER — Encounter: Payer: Self-pay | Admitting: Family Medicine

## 2024-03-31 ENCOUNTER — Ambulatory Visit
Admission: RE | Admit: 2024-03-31 | Discharge: 2024-03-31 | Disposition: A | Discharge status: Home / Self Care | Source: Ambulatory Visit | Attending: Family Medicine | Admitting: Family Medicine

## 2024-03-31 ENCOUNTER — Ambulatory Visit: Payer: Self-pay | Admitting: Family Medicine

## 2024-03-31 DIAGNOSIS — M50222 Other cervical disc displacement at C5-C6 level: Secondary | ICD-10-CM | POA: Diagnosis not present

## 2024-03-31 DIAGNOSIS — M542 Cervicalgia: Secondary | ICD-10-CM

## 2024-03-31 DIAGNOSIS — M4722 Other spondylosis with radiculopathy, cervical region: Secondary | ICD-10-CM | POA: Diagnosis not present

## 2024-03-31 DIAGNOSIS — M4802 Spinal stenosis, cervical region: Secondary | ICD-10-CM | POA: Diagnosis not present

## 2024-04-01 ENCOUNTER — Other Ambulatory Visit: Payer: Self-pay

## 2024-04-01 DIAGNOSIS — M542 Cervicalgia: Secondary | ICD-10-CM

## 2024-04-05 ENCOUNTER — Ambulatory Visit
Admission: RE | Admit: 2024-04-05 | Discharge: 2024-04-05 | Disposition: A | Discharge status: Home / Self Care | Source: Ambulatory Visit | Attending: Family Medicine | Admitting: Family Medicine

## 2024-04-05 DIAGNOSIS — M542 Cervicalgia: Secondary | ICD-10-CM

## 2024-04-05 DIAGNOSIS — M4722 Other spondylosis with radiculopathy, cervical region: Secondary | ICD-10-CM | POA: Diagnosis not present

## 2024-04-05 MED ORDER — TRIAMCINOLONE ACETONIDE 40 MG/ML IJ SUSP (RADIOLOGY)
60.0000 mg | Freq: Once | INTRAMUSCULAR | Status: AC
  Administered 2024-04-05: 60 mg via EPIDURAL

## 2024-04-05 MED ORDER — IOPAMIDOL (ISOVUE-M 300) INJECTION 61%
1.0000 mL | Freq: Once | INTRAMUSCULAR | Status: AC | PRN
  Administered 2024-04-05: 1 mL via INTRATHECAL

## 2024-04-05 NOTE — Discharge Instructions (Signed)

## 2024-04-13 ENCOUNTER — Other Ambulatory Visit (HOSPITAL_COMMUNITY): Payer: Self-pay

## 2024-04-13 DIAGNOSIS — H2513 Age-related nuclear cataract, bilateral: Secondary | ICD-10-CM | POA: Diagnosis not present

## 2024-04-13 DIAGNOSIS — H401133 Primary open-angle glaucoma, bilateral, severe stage: Secondary | ICD-10-CM | POA: Diagnosis not present

## 2024-04-13 MED ORDER — RHOPRESSA 0.02 % OP SOLN
1.0000 [drp] | Freq: Every day | OPHTHALMIC | 1 refills | Status: AC
  Filled 2024-04-13: qty 2.5, 30d supply, fill #0

## 2024-04-14 ENCOUNTER — Other Ambulatory Visit (HOSPITAL_COMMUNITY): Payer: Self-pay

## 2024-04-16 ENCOUNTER — Encounter: Payer: Self-pay | Admitting: Family Medicine

## 2024-04-19 ENCOUNTER — Other Ambulatory Visit (HOSPITAL_COMMUNITY): Payer: Self-pay

## 2024-04-20 ENCOUNTER — Other Ambulatory Visit (HOSPITAL_COMMUNITY): Payer: Self-pay

## 2024-04-21 ENCOUNTER — Other Ambulatory Visit: Payer: Self-pay

## 2024-04-21 ENCOUNTER — Other Ambulatory Visit (HOSPITAL_COMMUNITY): Payer: Self-pay

## 2024-04-21 DIAGNOSIS — M5412 Radiculopathy, cervical region: Secondary | ICD-10-CM

## 2024-04-22 ENCOUNTER — Ambulatory Visit: Admitting: Family Medicine

## 2024-04-23 ENCOUNTER — Other Ambulatory Visit: Payer: Self-pay

## 2024-04-23 DIAGNOSIS — M5412 Radiculopathy, cervical region: Secondary | ICD-10-CM

## 2024-04-26 ENCOUNTER — Ambulatory Visit: Admitting: Physical Therapy

## 2024-04-26 ENCOUNTER — Other Ambulatory Visit (HOSPITAL_COMMUNITY): Payer: Self-pay

## 2024-04-26 DIAGNOSIS — H401131 Primary open-angle glaucoma, bilateral, mild stage: Secondary | ICD-10-CM | POA: Diagnosis not present

## 2024-04-26 MED ORDER — ROCKLATAN 0.02-0.005 % OP SOLN
1.0000 [drp] | Freq: Every day | OPHTHALMIC | 3 refills | Status: AC
  Filled 2024-04-26: qty 5, 90d supply, fill #0
  Filled 2024-05-03: qty 7.5, 150d supply, fill #0
  Filled 2024-05-03: qty 2.5, 50d supply, fill #0

## 2024-04-27 ENCOUNTER — Other Ambulatory Visit (HOSPITAL_COMMUNITY): Payer: Self-pay

## 2024-04-29 ENCOUNTER — Other Ambulatory Visit (HOSPITAL_COMMUNITY): Payer: Self-pay

## 2024-04-30 ENCOUNTER — Other Ambulatory Visit (HOSPITAL_COMMUNITY): Payer: Self-pay

## 2024-05-03 ENCOUNTER — Other Ambulatory Visit: Payer: Self-pay

## 2024-05-03 ENCOUNTER — Other Ambulatory Visit (HOSPITAL_COMMUNITY): Payer: Self-pay

## 2024-05-03 ENCOUNTER — Ambulatory Visit: Payer: Self-pay | Attending: Family Medicine | Admitting: Rehabilitative and Restorative Service Providers"

## 2024-05-03 ENCOUNTER — Encounter: Payer: Self-pay | Admitting: Rehabilitative and Restorative Service Providers"

## 2024-05-03 DIAGNOSIS — R293 Abnormal posture: Secondary | ICD-10-CM | POA: Diagnosis not present

## 2024-05-03 DIAGNOSIS — M6281 Muscle weakness (generalized): Secondary | ICD-10-CM | POA: Insufficient documentation

## 2024-05-03 DIAGNOSIS — R252 Cramp and spasm: Secondary | ICD-10-CM

## 2024-05-03 DIAGNOSIS — M5412 Radiculopathy, cervical region: Secondary | ICD-10-CM | POA: Diagnosis not present

## 2024-05-03 MED ORDER — TIMOLOL MALEATE 0.5 % OP SOLN
1.0000 [drp] | Freq: Two times a day (BID) | OPHTHALMIC | 3 refills | Status: AC
  Filled 2024-05-03: qty 5, 50d supply, fill #0
  Filled 2024-07-06: qty 5, 50d supply, fill #1
  Filled 2024-08-24: qty 5, 50d supply, fill #2
  Filled 2024-10-14: qty 5, 50d supply, fill #3

## 2024-05-03 NOTE — Therapy (Signed)
 OUTPATIENT PHYSICAL THERAPY CERVICAL EVALUATION   Patient Name: Rachael Green MRN: [id number] DOB:[birth year], 58 y.o., female Today's Date: 05/03/2024  END OF SESSION:  PT End of Session - 05/03/24 1105     Visit Number 1    Date for PT Re-Evaluation 06/25/24    Authorization Type [place] [employer] Save    PT Start Time 1100    PT Stop Time 1140    PT Time Calculation (min) 40 min    Activity Tolerance Patient tolerated treatment well    Behavior During Therapy [facility] for tasks assessed/performed          Past Medical History:  Diagnosis Date   Allergy    Anxiety    Depression    Diverticulosis    GERD (gastroesophageal reflux disease)    Hypertension    Thyroid  disease    Past Surgical History:  Procedure Laterality Date   ARTHROSCOPIC REPAIR ACL     left   WISDOM TOOTH EXTRACTION     Patient Active Problem List   Diagnosis Date Noted   Left cervical radiculopathy 03/29/2024   Left knee pain 11/29/2021   Glaucoma 02/07/2021   Hyperglycemia 03/11/2019   Essential hypertension 11/11/2018   Hypothyroidism 11/11/2018   Depression, major, in remission (HCC) 11/11/2018   Anxiety 11/11/2018   GERD (gastroesophageal reflux disease) 11/11/2018   Dyslipidemia 11/11/2018   Allergic rhinitis 11/11/2018   Hearing loss 11/11/2018   OSA (obstructive sleep apnea) 11/11/2018   Steatosis of liver 01/09/2018   Lipoma of upper arm 05/10/2016   Insomnia 03/17/2012    PCP: Kennyth Worth HERO, MD  REFERRING PROVIDER: Claudene Arthea HERO, DO  REFERRING DIAG: [id number] (ICD-10-CM) - Left cervical radiculopathy  THERAPY DIAG:  Radiculopathy, cervical region  Muscle weakness (generalized)  Cramp and spasm  Abnormal posture  Rationale for Evaluation and Treatment: Rehabilitation  ONSET DATE: severely for June 2025  SUBJECTIVE:                                                                                                                                                                                                          SUBJECTIVE STATEMENT: Patient states that she initially had tennis elbow, and in June, she started feeling a tingling sensation down her arm.  She was referred to Dr Claudene and a MRI was performed.  Patient states that she had cervical epidural about 3-4 weeks ago and it helped some, but the pain never went away.  She states that she is now having the radicular pain down her left arm again.  Patient states that sometimes, she has difficulty with holding things in her left arm.  Hand dominance: Left  PERTINENT HISTORY:  HTN.  Cervical radiculopathy with epidural injection in July 2025.  PAIN:  Are you having pain? Yes: NPRS scale: 7-8/10 Pain location: cervical and radiating down left arm Pain description: needles/numb feeling Aggravating factors: unknown Relieving factors: sometimes a position change  PRECAUTIONS: None  RED FLAGS: None     WEIGHT BEARING RESTRICTIONS: No  FALLS:  Has patient fallen in last 6 months? No  LIVING ENVIRONMENT: Lives with: lives with their spouse Lives in: House/apartment Stairs: split level home Has following equipment at home: None  OCCUPATION: Retired  PLOF: Independent and Leisure: Pickle-ball  PATIENT GOALS: To return to playing pickle-ball and be able to work out at [employer].  NEXT MD VISIT: Dr Kennyth on 06/24/24  OBJECTIVE:  Note: Objective measures were completed at Evaluation unless otherwise noted.  DIAGNOSTIC FINDINGS:  Cervical MRI on 03/31/2024: IMPRESSION: 1. Cervical spondylosis as outlined within the body of the report. 2. At C5-C6, there is moderate disc degeneration. Disc bulge with endplate spurring and left greater than right uncovertebral hypertrophy. Superimposed small central disc protrusion. Mild spinal canal stenosis. The disc protrusion mildly flattens the ventral aspect of the spinal cord. Bilateral neural foraminal narrowing (moderate right, severe left). 3. At C6-C7,  there is moderate disc degeneration. Shallow disc bulge. Superimposed broad-based left center/foraminal disc protrusion. No significant spinal canal stenosis. The disc protrusion contributes to severe left neural foraminal narrowing. 4. No more than mild spinal canal narrowing, and no significant foraminal narrowing, at the remaining cervical levels. 5. Mild multilevel facet arthropathy.  Cervical Radiograph on 03/29/24: IMPRESSION: No acute displaced fracture or traumatic listhesis of the cervical spine.  PATIENT SURVEYS:  NDI:  NECK DISABILITY INDEX  Score Date: 05/03/2024  Pain intensity 2 = The pain is moderate at the moment  2. Personal care (washing, dressing, etc.) 1 =  I can look after myself normally but it causes extra pain  3. Lifting 2 = Pain prevents me lifting heavy weights off the floor, but I can manage if they are  conveniently placed, for example on a table  4. Reading 3 = I can't read as much as I want because of moderate pain in my neck  5. Headaches 0 = I have no headaches at all  6. Concentration 1 =  I can concentrate fully when I want to with slight difficulty   7. Work 2 = I can do most of my usual work, but no more  8. Driving 3 = I can't drive my car as long as I want because of moderate pain in my neck  9. Sleeping 2 = My sleep is mildly disturbed (1-2 hrs sleepless)  10. Recreation 3 = I am able to engage in a few of my usual recreation activities because of pain in   my neck  Total 19/50 = 38%   Minimum Detectable Change (90% confidence): 5 points or 10% points  COGNITION: Overall cognitive status: Within functional limits for tasks assessed  SENSATION: Pt reports numbness and tingling down left arm  POSTURE: rounded shoulders and forward head  PALPATION: Patient with increased muscle tension/spasms noted along bilateral cervical paraspinals, upper traps, and rhomboids   CERVICAL ROM:   Active ROM A/ROM (deg) eval  Flexion 44  Extension 40  Right  lateral flexion 40  Left lateral flexion 35  Right rotation 45  Left rotation 45   (  Blank rows = not tested)  UPPER EXTREMITY ROM:  WFL  UPPER EXTREMITY MMT:  MMT Right eval Left eval  Shoulder flexion 5 5-  Shoulder extension    Shoulder abduction 5 4+  Shoulder adduction    Shoulder internal rotation 4+ 4-  Shoulder external rotation 4+ 4-  Middle trapezius    Lower trapezius    Grip strength 49 45   (Blank rows = not tested)  CERVICAL SPECIAL TESTS:  Distraction test: patient reports less pain  FUNCTIONAL TESTS:  5 times sit to stand: 6.28 sec  TREATMENT DATE:  05/03/2024: Initial Evaluation and review of purpose and role of PT Issued and reviewed HEP  PATIENT EDUCATION:  Education details: Issued HEP Person educated: Patient Education method: Explanation, Demonstration, and Handouts Education comprehension: verbalized understanding and returned demonstration  HOME EXERCISE PROGRAM: Access Code: [email or web address] URL: [email or web address] Date: 05/03/2024 Prepared by: Jarrell Hridaan Bouse  Exercises - Seated Upper Trapezius Stretch  - 1 x daily - 7 x weekly - 2 reps - 20 sec hold - Seated Levator Scapulae Stretch  - 1 x daily - 7 x weekly - 2 reps - 20 sec hold - Seated Cervical Retraction  - 1 x daily - 7 x weekly - 2 sets - 10 reps - Mid-Lower Cervical Extension SNAG with Strap  - 1 x daily - 7 x weekly - 2 sets - 10 reps - Shoulder External Rotation and Scapular Retraction with Resistance  - 1 x daily - 7 x weekly - 2 sets - 10 reps - Standing Shoulder Horizontal Abduction with Resistance  - 1 x daily - 7 x weekly - 2 sets - 10 reps  ASSESSMENT:  CLINICAL IMPRESSION: Patient is a 58 y.o. female who was seen today for physical therapy evaluation and treatment for left cervical radiculopathy.  Patient states that she has been having worsening symptoms since June.  She has had a MRI and was referred for an epidural injection that helped some initially, but  reports that the symptoms are starting to come back.  Patient is hoping that PT can help her some, as she is afraid to play pickle-ball or other exercises for fear of worsening her symptoms.  Patient states that she has increased pain with holding her arm on the steering wheel during driving.  Patient also reports that she is left hand dominant and that she feels that her grip is now being impaired.  Patient presents with increased pain, muscle weakness, decreased grip strength, abnormal posture, muscle spasms, and difficulty performing functional tasks without increased pain.  Patient would benefit from skilled PT to progress towards goal related activities and allow her to return to being able to play pickle-ball without difficulty.   OBJECTIVE IMPAIRMENTS: decreased strength, increased fascial restrictions, increased muscle spasms, impaired sensation, postural dysfunction, and pain.   ACTIVITY LIMITATIONS: carrying, lifting, and sleeping  PARTICIPATION LIMITATIONS: cleaning, driving, and community activity  PERSONAL FACTORS: Past/current experiences, Time since onset of injury/illness/exacerbation, and 1 comorbidity: HTN are also affecting patient's functional outcome.   REHAB POTENTIAL: Good  CLINICAL DECISION MAKING: Stable/uncomplicated  EVALUATION COMPLEXITY: Low   GOALS: Goals reviewed with patient? Yes  SHORT TERM GOALS: Target date: 05/28/2024  Patient will be independent with initial HEP. Baseline:  Goal status: INITIAL  2.  Patient will report at least a 25% improvement in symptoms since starting PT. Baseline:  Goal status: INITIAL   LONG TERM GOALS: Target date: 06/25/2024  Patient will be independent with advanced HEP to allow  for self progression after discharge. Baseline:  Goal status: INITIAL  2.  Patient will report ability to play pickle-ball without increased pain. Baseline:  Goal status: INITIAL  3.  Patient will increase left grip strength to at least 50#  to allow her to open tight jars. Baseline: 45# Goal status: INITIAL  4.  Patient will improve Neck Disability Index to no greater than 20% to demonstrate improvements in functional tasks. Baseline: 38% Goal status: INITIAL  5.  Patient will increase left shoulder strength to at least 4+ to 5-/5 to allow her to perform more advanced tasks at home, such as cleaning and performing yard work. Baseline:  Goal status: INITIAL   PLAN:  PT FREQUENCY: 1-2x/week  PT DURATION: 8 weeks  PLANNED INTERVENTIONS: 97164- PT Re-evaluation, 97750- Physical Performance Testing, 97110-Therapeutic exercises, 97530- Therapeutic activity, 97112- Neuromuscular re-education, 97535- Self Care, 02859- Manual therapy, [id number]- Canalith repositioning, [id number]- Aquatic Therapy, [phone]- Electrical stimulation (unattended), [phone]- Electrical stimulation (manual), [id number]- Vasopneumatic device, [id number]- Ultrasound, [id number]- Traction (mechanical), [id number]- Ionotophoresis 4mg /ml Dexamethasone, 79439 (1-2 muscles), 20561 (3+ muscles)- Dry Needling, Patient/Family education, Balance training, Taping, Joint mobilization, Joint manipulation, Spinal manipulation, Spinal mobilization, Vestibular training, Cryotherapy, and Moist heat  PLAN FOR NEXT SESSION: Assess and progress HEP as indicated, strengthening, flexibility, manual/dry needling as indicated    Jarrell Laming, PT, DPT 05/03/24, 1:37 PM  [facility] [address] [place], [place] [address] Phone # [phone] Fax [phone]

## 2024-05-04 ENCOUNTER — Other Ambulatory Visit (HOSPITAL_COMMUNITY): Payer: Self-pay

## 2024-05-05 ENCOUNTER — Other Ambulatory Visit (HOSPITAL_COMMUNITY): Payer: Self-pay

## 2024-05-05 MED ORDER — SIMBRINZA 1-0.2 % OP SUSP
OPHTHALMIC | 4 refills | Status: AC
  Filled 2024-05-05: qty 8, 30d supply, fill #0

## 2024-05-13 ENCOUNTER — Ambulatory Visit: Admitting: Physical Therapy

## 2024-05-13 ENCOUNTER — Ambulatory Visit: Payer: Self-pay | Admitting: Rehabilitative and Restorative Service Providers"

## 2024-05-13 ENCOUNTER — Encounter: Payer: Self-pay | Admitting: Rehabilitative and Restorative Service Providers"

## 2024-05-13 DIAGNOSIS — R293 Abnormal posture: Secondary | ICD-10-CM | POA: Diagnosis not present

## 2024-05-13 DIAGNOSIS — M5412 Radiculopathy, cervical region: Secondary | ICD-10-CM | POA: Diagnosis not present

## 2024-05-13 DIAGNOSIS — R252 Cramp and spasm: Secondary | ICD-10-CM

## 2024-05-13 DIAGNOSIS — M6281 Muscle weakness (generalized): Secondary | ICD-10-CM

## 2024-05-13 DIAGNOSIS — Z1231 Encounter for screening mammogram for malignant neoplasm of breast: Secondary | ICD-10-CM | POA: Diagnosis not present

## 2024-05-13 LAB — HM MAMMOGRAPHY

## 2024-05-13 NOTE — Therapy (Signed)
 OUTPATIENT PHYSICAL THERAPY TREATMENT NOTE   Patient Name: Rachael Green MRN: [id number] DOB:01-Jul-1966, 58 y.o., female Today's Date: 05/13/2024  END OF SESSION:  PT End of Session - 05/13/24 0806     Visit Number 2    Date for PT Re-Evaluation 06/25/24    Authorization Type [place] [employer] Save    PT Start Time 0700    PT Stop Time [phone]    PT Time Calculation (min) 35 min    Activity Tolerance Patient tolerated treatment well    Behavior During Therapy [facility] for tasks assessed/performed           Past Medical History:  Diagnosis Date   Allergy    Anxiety    Depression    Diverticulosis    GERD (gastroesophageal reflux disease)    Hypertension    Thyroid  disease    Past Surgical History:  Procedure Laterality Date   ARTHROSCOPIC REPAIR ACL     left   WISDOM TOOTH EXTRACTION     Patient Active Problem List   Diagnosis Date Noted   Left cervical radiculopathy 03/29/2024   Left knee pain 11/29/2021   Glaucoma 02/07/2021   Hyperglycemia 03/11/2019   Essential hypertension 11/11/2018   Hypothyroidism 11/11/2018   Depression, major, in remission (HCC) 11/11/2018   Anxiety 11/11/2018   GERD (gastroesophageal reflux disease) 11/11/2018   Dyslipidemia 11/11/2018   Allergic rhinitis 11/11/2018   Hearing loss 11/11/2018   OSA (obstructive sleep apnea) 11/11/2018   Steatosis of liver 01/09/2018   Lipoma of upper arm 05/10/2016   Insomnia 03/17/2012    PCP: Kennyth Worth HERO, MD  REFERRING PROVIDER: Claudene Arthea HERO, DO  REFERRING DIAG: [phone] (ICD-10-CM) - Left cervical radiculopathy  THERAPY DIAG:  Muscle weakness (generalized)  Cramp and spasm  Radiculopathy, cervical region  Abnormal posture  Rationale for Evaluation and Treatment: Rehabilitation  ONSET DATE: severely for June 2025  SUBJECTIVE:                                                                                                                                                                                                          SUBJECTIVE STATEMENT: Pt reports she did the exercises and found the overhead and shoulder ER to be slightly painful and difficult, stating she is very weak. She said she plans to go to the gym after therapy and practice her HEP.   Hand dominance: Left  PERTINENT HISTORY:  HTN.  Cervical radiculopathy with epidural injection in July 2025.  PAIN:  Are you having pain? Yes: NPRS scale: 2/10 Pain  location: cervical and radiating down left arm Pain description: needles/numb feeling Aggravating factors: unknown Relieving factors: sometimes a position change  PRECAUTIONS: None  RED FLAGS: None     WEIGHT BEARING RESTRICTIONS: No  FALLS:  Has patient fallen in last 6 months? No  LIVING ENVIRONMENT: Lives with: lives with their spouse Lives in: House/apartment Stairs: split level home Has following equipment at home: None  OCCUPATION: Retired  PLOF: Independent and Leisure: Pickle-ball  PATIENT GOALS: To return to playing pickle-ball and be able to work out at [employer].  NEXT MD VISIT: Dr Kennyth on 06/24/24  OBJECTIVE:  Note: Objective measures were completed at Evaluation unless otherwise noted.  DIAGNOSTIC FINDINGS:  Cervical MRI on 03/31/2024: IMPRESSION: 1. Cervical spondylosis as outlined within the body of the report. 2. At C5-C6, there is moderate disc degeneration. Disc bulge with endplate spurring and left greater than right uncovertebral hypertrophy. Superimposed small central disc protrusion. Mild spinal canal stenosis. The disc protrusion mildly flattens the ventral aspect of the spinal cord. Bilateral neural foraminal narrowing (moderate right, severe left). 3. At C6-C7, there is moderate disc degeneration. Shallow disc bulge. Superimposed broad-based left center/foraminal disc protrusion. No significant spinal canal stenosis. The disc protrusion contributes to severe left neural foraminal narrowing. 4. No more than  mild spinal canal narrowing, and no significant foraminal narrowing, at the remaining cervical levels. 5. Mild multilevel facet arthropathy.  Cervical Radiograph on 03/29/24: IMPRESSION: No acute displaced fracture or traumatic listhesis of the cervical spine.  PATIENT SURVEYS:  NDI:  NECK DISABILITY INDEX  Score Date: 05/03/2024  Pain intensity 2 = The pain is moderate at the moment  2. Personal care (washing, dressing, etc.) 1 =  I can look after myself normally but it causes extra pain  3. Lifting 2 = Pain prevents me lifting heavy weights off the floor, but I can manage if they are  conveniently placed, for example on a table  4. Reading 3 = I can't read as much as I want because of moderate pain in my neck  5. Headaches 0 = I have no headaches at all  6. Concentration 1 =  I can concentrate fully when I want to with slight difficulty   7. Work 2 = I can do most of my usual work, but no more  8. Driving 3 = I can't drive my car as long as I want because of moderate pain in my neck  9. Sleeping 2 = My sleep is mildly disturbed (1-2 hrs sleepless)  10. Recreation 3 = I am able to engage in a few of my usual recreation activities because of pain in   my neck  Total 19/50 = 38%   Minimum Detectable Change (90% confidence): 5 points or 10% points  COGNITION: Overall cognitive status: Within functional limits for tasks assessed  SENSATION: Pt reports numbness and tingling down left arm  POSTURE: rounded shoulders and forward head  PALPATION: Patient with increased muscle tension/spasms noted along bilateral cervical paraspinals, upper traps, and rhomboids   CERVICAL ROM:   Active ROM A/ROM (deg) eval  Flexion 44  Extension 40  Right lateral flexion 40  Left lateral flexion 35  Right rotation 45  Left rotation 45   (Blank rows = not tested)  UPPER EXTREMITY ROM:  WFL  UPPER EXTREMITY MMT:  MMT Right eval Left eval  Shoulder flexion 5 5-  Shoulder extension     Shoulder abduction 5 4+  Shoulder adduction    Shoulder internal  rotation 4+ 4-  Shoulder external rotation 4+ 4-  Middle trapezius    Lower trapezius    Grip strength 49 45   (Blank rows = not tested)  CERVICAL SPECIAL TESTS:  Distraction test: patient reports less pain  FUNCTIONAL TESTS:  5 times sit to stand: 6.28 sec  TREATMENT DATE:    05/13/24 Nustep level 3 for 5 min- PT student monitored to discuss pt status Posterior pelvic tilts 3x8 for postural support Cervical snags in extension 2x8- she likes this one Shoulder abduction with red tband in seated 2x10 Shoulder ER with red tband in seated 2x15 Sleeper's stretch triggered pain within sec so asked pt to lie on back and pain subsided Twisties x15 Flex/ext both wrists for forearm muscle activation to improve grip strength Levator scap stretch 2x20 sec - she states this felt good   05/03/2024: Initial Evaluation and review of purpose and role of PT Issued and reviewed HEP  PATIENT EDUCATION:  Education details: Issued HEP Person educated: Patient Education method: Explanation, Demonstration, and Handouts Education comprehension: verbalized understanding and returned demonstration  HOME EXERCISE PROGRAM: Access Code: [email or web address] URL: [email or web address] Date: 05/03/2024 Prepared by: Jarrell Menke  Exercises - Seated Upper Trapezius Stretch  - 1 x daily - 7 x weekly - 2 reps - 20 sec hold - Seated Levator Scapulae Stretch  - 1 x daily - 7 x weekly - 2 reps - 20 sec hold - Seated Cervical Retraction  - 1 x daily - 7 x weekly - 2 sets - 10 reps - Mid-Lower Cervical Extension SNAG with Strap  - 1 x daily - 7 x weekly - 2 sets - 10 reps - Shoulder External Rotation and Scapular Retraction with Resistance  - 1 x daily - 7 x weekly - 2 sets - 10 reps - Standing Shoulder Horizontal Abduction with Resistance  - 1 x daily - 7 x weekly - 2 sets - 10 reps  ASSESSMENT:  CLINICAL IMPRESSION:   Ms Meadow  presents to skilled PT with complaints of numbness/tingling with slight pain down her left arm in the night and during activity. Pt stated working on her HEP since her eval and asked questions regarding additional gym exercises, thus today's focus was on reviewing her HEP to assure she's doing all exercises correctly. Pt required verbal and tactile cues for scapula activating exercises, stating I don't feel my shoulder blades moving, thus discussed patient education on importance of mind to muscle connection, as it proves to enhance nervous system, leading to optimizing results. Pt did not do well with the sleeper's stretch, stating it caused her 9/10 pain, however after instructed to roll onto her back she states pain subsided. Plan to add the AROM shoulder retraction with Standing rows exercise for next visit to improve posture. Pt educated on dry needling, showing interest for future visits. Pt would benefit from skilled PT to progress towards goal related activities and allow her to return to being able to play pickle ball without difficulty.   OBJECTIVE IMPAIRMENTS: decreased strength, increased fascial restrictions, increased muscle spasms, impaired sensation, postural dysfunction, and pain.   ACTIVITY LIMITATIONS: carrying, lifting, and sleeping  PARTICIPATION LIMITATIONS: cleaning, driving, and community activity  PERSONAL FACTORS: Past/current experiences, Time since onset of injury/illness/exacerbation, and 1 comorbidity: HTN are also affecting patient's functional outcome.   REHAB POTENTIAL: Good  CLINICAL DECISION MAKING: Stable/uncomplicated  EVALUATION COMPLEXITY: Low   GOALS: Goals reviewed with patient? Yes  SHORT TERM GOALS: Target date: 05/28/2024  Patient will  be independent with initial HEP. Baseline:  Goal status: Ongoing  2.  Patient will report at least a 25% improvement in symptoms since starting PT. Baseline:  Goal status: INITIAL   LONG TERM GOALS: Target  date: 06/25/2024  Patient will be independent with advanced HEP to allow for self progression after discharge. Baseline:  Goal status: INITIAL  2.  Patient will report ability to play pickle-ball without increased pain. Baseline:  Goal status: INITIAL  3.  Patient will increase left grip strength to at least 50# to allow her to open tight jars. Baseline: 45# Goal status: INITIAL  4.  Patient will improve Neck Disability Index to no greater than 20% to demonstrate improvements in functional tasks. Baseline: 38% Goal status: INITIAL  5.  Patient will increase left shoulder strength to at least 4+ to 5-/5 to allow her to perform more advanced tasks at home, such as cleaning and performing yard work. Baseline:  Goal status: INITIAL   PLAN:  PT FREQUENCY: 1-2x/week  PT DURATION: 8 weeks  PLANNED INTERVENTIONS: 97164- PT Re-evaluation, 97750- Physical Performance Testing, 97110-Therapeutic exercises, 97530- Therapeutic activity, 97112- Neuromuscular re-education, 97535- Self Care, 02859- Manual therapy, [phone]- Canalith repositioning, [id number]- Aquatic Therapy, H9716- Electrical stimulation (unattended), [id number]- Electrical stimulation (manual), [id number]- Vasopneumatic device, [id number]- Ultrasound, [id number]- Traction (mechanical), [id number]- Ionotophoresis 4mg /ml Dexamethasone, 79439 (1-2 muscles), 20561 (3+ muscles)- Dry Needling, Patient/Family education, Balance training, Taping, Joint mobilization, Joint manipulation, Spinal manipulation, Spinal mobilization, Vestibular training, Cryotherapy, and Moist heat  PLAN FOR NEXT SESSION: Assess and progress HEP as indicated, strengthening, flexibility, manual/dry needling as indicated, AROM shoulder retraction, Standing Rows   Lavanda Cleverly, SPT 05/13/24 9:49 AM   I agree with the following treatment note after reviewing documentation. This session was performed under the supervision of a licensed clinician. Jarrell Laming, PT, DPT 05/13/24, 9:49  AM  [facility] [address] [place], [place] [address] Phone # [phone] Fax [phone]

## 2024-05-17 ENCOUNTER — Encounter: Payer: Self-pay | Admitting: Physical Therapy

## 2024-05-17 ENCOUNTER — Ambulatory Visit: Admitting: Physical Therapy

## 2024-05-17 DIAGNOSIS — R293 Abnormal posture: Secondary | ICD-10-CM | POA: Diagnosis not present

## 2024-05-17 DIAGNOSIS — M6281 Muscle weakness (generalized): Secondary | ICD-10-CM | POA: Diagnosis not present

## 2024-05-17 DIAGNOSIS — M5412 Radiculopathy, cervical region: Secondary | ICD-10-CM

## 2024-05-17 DIAGNOSIS — R252 Cramp and spasm: Secondary | ICD-10-CM

## 2024-05-17 NOTE — Therapy (Signed)
 OUTPATIENT PHYSICAL THERAPY TREATMENT NOTE   Patient Name: Rachael Green MRN: [id number] DOB:1966-07-06, 58 y.o., female Today's Date: 05/17/2024  END OF SESSION:  PT End of Session - 05/17/24 0854     Visit Number 3    Authorization Type [place] [employer]    PT Start Time 0848    PT Stop Time [phone]    PT Time Calculation (min) 43 min    Activity Tolerance Patient tolerated treatment well    Behavior During Therapy [facility] for tasks assessed/performed            Past Medical History:  Diagnosis Date   Allergy    Anxiety    Depression    Diverticulosis    GERD (gastroesophageal reflux disease)    Hypertension    Thyroid  disease    Past Surgical History:  Procedure Laterality Date   ARTHROSCOPIC REPAIR ACL     left   WISDOM TOOTH EXTRACTION     Patient Active Problem List   Diagnosis Date Noted   Left cervical radiculopathy 03/29/2024   Left knee pain 11/29/2021   Glaucoma 02/07/2021   Hyperglycemia 03/11/2019   Essential hypertension 11/11/2018   Hypothyroidism 11/11/2018   Depression, major, in remission (HCC) 11/11/2018   Anxiety 11/11/2018   GERD (gastroesophageal reflux disease) 11/11/2018   Dyslipidemia 11/11/2018   Allergic rhinitis 11/11/2018   Hearing loss 11/11/2018   OSA (obstructive sleep apnea) 11/11/2018   Steatosis of liver 01/09/2018   Lipoma of upper arm 05/10/2016   Insomnia 03/17/2012    PCP: Kennyth Worth HERO, MD  REFERRING PROVIDER: Claudene Arthea HERO, DO  REFERRING DIAG: [phone] (ICD-10-CM) - Left cervical radiculopathy  THERAPY DIAG:  Muscle weakness (generalized)  Cramp and spasm  Radiculopathy, cervical region  Abnormal posture  Rationale for Evaluation and Treatment: Rehabilitation  ONSET DATE: severely for June 2025  SUBJECTIVE:                                                                                                                                                                                                          SUBJECTIVE STATEMENT: It comes and goes. A little tingling in the L forearm  Hand dominance: Left  PERTINENT HISTORY:  HTN.  Cervical radiculopathy with epidural injection in July 2025.  PAIN:  Are you having pain? Yes: NPRS scale: 0/10 Pain location: cervical and radiating down left arm Pain description: needles/numb feeling Aggravating factors: unknown Relieving factors: sometimes a position change  PRECAUTIONS: None  RED FLAGS: None     WEIGHT BEARING RESTRICTIONS: No  FALLS:  Has patient fallen in last 6 months? No  LIVING ENVIRONMENT: Lives with: lives with their spouse Lives in: House/apartment Stairs: split level home Has following equipment at home: None  OCCUPATION: Retired  PLOF: Independent and Leisure: Pickle-ball  PATIENT GOALS: To return to playing pickle-ball and be able to work out at [employer].  NEXT MD VISIT: Dr Kennyth on 06/24/24  OBJECTIVE:  Note: Objective measures were completed at Evaluation unless otherwise noted.  DIAGNOSTIC FINDINGS:  Cervical MRI on 03/31/2024: IMPRESSION: 1. Cervical spondylosis as outlined within the body of the report. 2. At C5-C6, there is moderate disc degeneration. Disc bulge with endplate spurring and left greater than right uncovertebral hypertrophy. Superimposed small central disc protrusion. Mild spinal canal stenosis. The disc protrusion mildly flattens the ventral aspect of the spinal cord. Bilateral neural foraminal narrowing (moderate right, severe left). 3. At C6-C7, there is moderate disc degeneration. Shallow disc bulge. Superimposed broad-based left center/foraminal disc protrusion. No significant spinal canal stenosis. The disc protrusion contributes to severe left neural foraminal narrowing. 4. No more than mild spinal canal narrowing, and no significant foraminal narrowing, at the remaining cervical levels. 5. Mild multilevel facet arthropathy.  Cervical Radiograph on 03/29/24: IMPRESSION: No  acute displaced fracture or traumatic listhesis of the cervical spine.  PATIENT SURVEYS:  NDI:  NECK DISABILITY INDEX  Score Date: 05/03/2024  Pain intensity 2 = The pain is moderate at the moment  2. Personal care (washing, dressing, etc.) 1 =  I can look after myself normally but it causes extra pain  3. Lifting 2 = Pain prevents me lifting heavy weights off the floor, but I can manage if they are  conveniently placed, for example on a table  4. Reading 3 = I can't read as much as I want because of moderate pain in my neck  5. Headaches 0 = I have no headaches at all  6. Concentration 1 =  I can concentrate fully when I want to with slight difficulty   7. Work 2 = I can do most of my usual work, but no more  8. Driving 3 = I can't drive my car as long as I want because of moderate pain in my neck  9. Sleeping 2 = My sleep is mildly disturbed (1-2 hrs sleepless)  10. Recreation 3 = I am able to engage in a few of my usual recreation activities because of pain in   my neck  Total 19/50 = 38%   Minimum Detectable Change (90% confidence): 5 points or 10% points  COGNITION: Overall cognitive status: Within functional limits for tasks assessed  SENSATION: Pt reports numbness and tingling down left arm  POSTURE: rounded shoulders and forward head  PALPATION: Patient with increased muscle tension/spasms noted along bilateral cervical paraspinals, upper traps, and rhomboids   CERVICAL ROM:   Active ROM A/ROM (deg) eval  Flexion 44  Extension 40  Right lateral flexion 40  Left lateral flexion 35  Right rotation 45  Left rotation 45   (Blank rows = not tested)  UPPER EXTREMITY ROM:  WFL  UPPER EXTREMITY MMT:  MMT Right eval Left eval  Shoulder flexion 5 5-  Shoulder extension    Shoulder abduction 5 4+  Shoulder adduction    Shoulder internal rotation 4+ 4-  Shoulder external rotation 4+ 4-  Middle trapezius    Lower trapezius    Grip strength 49 45   (Blank rows =  not tested)  CERVICAL SPECIAL TESTS:  Distraction test: patient reports less pain  FUNCTIONAL TESTS:  5 times sit to stand: 6.28 sec  TREATMENT DATE:  05/13/24 UBE fwd/bwd PT monitored to discuss pt status and posture - experiences increased numbness with fwd, no numbness bwd ULTT - all positive (radial worst) Cervical snags in extension 1x8- she likes this one Seated chin tuck 5 sec hold x 0 Standing Shoulder abduction with red tband  2x10 Standing Shoulder ER with red tband 2x10 Seated row 25# x 10 Standing 3 way raises 3# x 10 ea; also reviewed with red band for HEP Single arm IR and ER with red x 10 ea (added to HEP) Attempted manual traction and self manual traction with strap- mild relief   05/13/24 Nustep level 3 for 5 min- PT student monitored to discuss pt status Posterior pelvic tilts 3x8 for postural support Cervical snags in extension 2x8- she likes this one Shoulder abduction with red tband in seated 2x10 Shoulder ER with red tband in seated 2x15 Sleeper's stretch triggered pain within sec so asked pt to lie on back and pain subsided Twisties x15 Flex/ext both wrists for forearm muscle activation to improve grip strength Levator scap stretch 2x20 sec - she states this felt good   05/03/2024: Initial Evaluation and review of purpose and role of PT Issued and reviewed HEP  PATIENT EDUCATION:  Education details: Issued HEP Person educated: Patient Education method: Explanation, Demonstration, and Handouts Education comprehension: verbalized understanding and returned demonstration  HOME EXERCISE PROGRAM: Access Code: [email or web address] URL: [email or web address] Date: 05/17/2024 Prepared by: Mliss  Exercises - Seated Upper Trapezius Stretch  - 1 x daily - 7 x weekly - 2 reps - 20 sec hold - Seated Levator Scapulae Stretch  - 1 x daily - 7 x weekly - 2 reps - 20 sec hold - Seated Cervical Retraction  - 1 x daily - 7 x weekly - 2 sets - 10 reps -  Mid-Lower Cervical Extension SNAG with Strap  - 1 x daily - 7 x weekly - 2 sets - 10 reps - Shoulder External Rotation and Scapular Retraction with Resistance  - 1 x daily - 7 x weekly - 2 sets - 10 reps - Standing Shoulder Horizontal Abduction with Resistance  - 1 x daily - 7 x weekly - 2 sets - 10 reps - Shoulder Internal Rotation with Resistance  - 1 x daily - 3 x weekly - 2 sets - 10 reps - Shoulder External Rotation with Anchored Resistance  - 1 x daily - 3 x weekly - 2 sets - 10 reps - Standing Shoulder Flexion with Resistance  - 1 x daily - 3 x weekly - 2 sets - 10 reps - Scaption with Resistance  - 1 x daily - 3 x weekly - 2 sets - 10 reps - Standing Single Arm Shoulder Abduction with Resistance  - 1 x daily - 3 x weekly - 2 sets - 10 reps  ASSESSMENT:  CLINICAL IMPRESSION: Patient with ongoing tingling into L UE. Tingling occurred and resolved intermittently throughout session today with no specific cause. She does consistently get relief with R SB and provocation with L SB of the neck. We focused on strengthening today with good response. She was challenged by all theraband exercises and these were added to HEP. She may benefit from trial of DN. She continues to demonstrate potential for improvement and would benefit from continued skilled therapy to address impairments.    OBJECTIVE IMPAIRMENTS: decreased strength, increased fascial restrictions,  increased muscle spasms, impaired sensation, postural dysfunction, and pain.   ACTIVITY LIMITATIONS: carrying, lifting, and sleeping  PARTICIPATION LIMITATIONS: cleaning, driving, and community activity  PERSONAL FACTORS: Past/current experiences, Time since onset of injury/illness/exacerbation, and 1 comorbidity: HTN are also affecting patient's functional outcome.   REHAB POTENTIAL: Good  CLINICAL DECISION MAKING: Stable/uncomplicated  EVALUATION COMPLEXITY: Low   GOALS: Goals reviewed with patient? Yes  SHORT TERM GOALS: Target  date: 05/28/2024  Patient will be independent with initial HEP. Baseline:  Goal status: Ongoing  2.  Patient will report at least a 25% improvement in symptoms since starting PT. Baseline:  Goal status: INITIAL   LONG TERM GOALS: Target date: 06/25/2024  Patient will be independent with advanced HEP to allow for self progression after discharge. Baseline:  Goal status: INITIAL  2.  Patient will report ability to play pickle-ball without increased pain. Baseline:  Goal status: INITIAL  3.  Patient will increase left grip strength to at least 50# to allow her to open tight jars. Baseline: 45# Goal status: INITIAL  4.  Patient will improve Neck Disability Index to no greater than 20% to demonstrate improvements in functional tasks. Baseline: 38% Goal status: INITIAL  5.  Patient will increase left shoulder strength to at least 4+ to 5-/5 to allow her to perform more advanced tasks at home, such as cleaning and performing yard work. Baseline:  Goal status: INITIAL   PLAN:  PT FREQUENCY: 1-2x/week  PT DURATION: 8 weeks  PLANNED INTERVENTIONS: 97164- PT Re-evaluation, 97750- Physical Performance Testing, 97110-Therapeutic exercises, 97530- Therapeutic activity, 97112- Neuromuscular re-education, 97535- Self Care, [id number]- Manual therapy, [phone]- Canalith repositioning, [id number]- Aquatic Therapy, [phone]- Electrical stimulation (unattended), [phone]- Electrical stimulation (manual), [id number]- Vasopneumatic device, [id number]- Ultrasound, [id number]- Traction (mechanical), [id number]- Ionotophoresis 4mg /ml Dexamethasone, [id number] (1-2 muscles), [id number] (3+ muscles)- Dry Needling, Patient/Family education, Balance training, Taping, Joint mobilization, Joint manipulation, Spinal manipulation, Spinal mobilization, Vestibular training, Cryotherapy, and Moist heat  PLAN FOR NEXT SESSION: Assess and progress HEP as indicated, strengthening, flexibility, manual/dry needling as indicated, AROM shoulder retraction, Standing  Rows   Mliss Cummins, PT 05/17/24 10:41 AM   [facility] [address], [address] [place], [place] [address] Phone # [phone] Fax [phone]

## 2024-05-19 ENCOUNTER — Encounter: Payer: Self-pay | Admitting: Rehabilitative and Restorative Service Providers"

## 2024-05-19 ENCOUNTER — Ambulatory Visit: Admitting: Rehabilitative and Restorative Service Providers"

## 2024-05-19 ENCOUNTER — Encounter: Payer: Self-pay | Admitting: Family Medicine

## 2024-05-19 DIAGNOSIS — M5412 Radiculopathy, cervical region: Secondary | ICD-10-CM | POA: Diagnosis not present

## 2024-05-19 DIAGNOSIS — R293 Abnormal posture: Secondary | ICD-10-CM

## 2024-05-19 DIAGNOSIS — M6281 Muscle weakness (generalized): Secondary | ICD-10-CM

## 2024-05-19 DIAGNOSIS — R252 Cramp and spasm: Secondary | ICD-10-CM

## 2024-05-19 NOTE — Therapy (Signed)
 OUTPATIENT PHYSICAL THERAPY TREATMENT NOTE   Patient Name: Rachael Green MRN: [id number] DOB:30-Jun-1966, 58 y.o., female Today's Date: 05/19/2024  END OF SESSION:  PT End of Session - 05/19/24 1105     Visit Number 4    Date for PT Re-Evaluation 06/25/24    Authorization Type [place] [employer]    PT Start Time 1100    PT Stop Time 1140    PT Time Calculation (min) 40 min    Activity Tolerance Patient tolerated treatment well    Behavior During Therapy [facility] for tasks assessed/performed            Past Medical History:  Diagnosis Date   Allergy    Anxiety    Depression    Diverticulosis    GERD (gastroesophageal reflux disease)    Hypertension    Thyroid  disease    Past Surgical History:  Procedure Laterality Date   ARTHROSCOPIC REPAIR ACL     left   WISDOM TOOTH EXTRACTION     Patient Active Problem List   Diagnosis Date Noted   Left cervical radiculopathy 03/29/2024   Left knee pain 11/29/2021   Glaucoma 02/07/2021   Hyperglycemia 03/11/2019   Essential hypertension 11/11/2018   Hypothyroidism 11/11/2018   Depression, major, in remission (HCC) 11/11/2018   Anxiety 11/11/2018   GERD (gastroesophageal reflux disease) 11/11/2018   Dyslipidemia 11/11/2018   Allergic rhinitis 11/11/2018   Hearing loss 11/11/2018   OSA (obstructive sleep apnea) 11/11/2018   Steatosis of liver 01/09/2018   Lipoma of upper arm 05/10/2016   Insomnia 03/17/2012    PCP: Kennyth Worth HERO, MD  REFERRING PROVIDER: Claudene Arthea HERO, DO  REFERRING DIAG: [phone] (ICD-10-CM) - Left cervical radiculopathy  THERAPY DIAG:  Muscle weakness (generalized)  Cramp and spasm  Radiculopathy, cervical region  Abnormal posture  Rationale for Evaluation and Treatment: Rehabilitation  ONSET DATE: severely for June 2025  SUBJECTIVE:                                                                                                                                                                                                          SUBJECTIVE STATEMENT: Patient reports that she is feeling better since last visit.  She denies numbness.  Hand dominance: Left  PERTINENT HISTORY:  HTN.  Cervical radiculopathy with epidural injection in July 2025.  PAIN:  Are you having pain? Yes: NPRS scale: 2/10 Pain location: cervical and radiating down left arm Pain description: needles/numb feeling Aggravating factors: unknown Relieving factors: sometimes a position change  PRECAUTIONS: None  RED  FLAGS: None     WEIGHT BEARING RESTRICTIONS: No  FALLS:  Has patient fallen in last 6 months? No  LIVING ENVIRONMENT: Lives with: lives with their spouse Lives in: House/apartment Stairs: split level home Has following equipment at home: None  OCCUPATION: Retired  PLOF: Independent and Leisure: Pickle-ball  PATIENT GOALS: To return to playing pickle-ball and be able to work out at [employer].  NEXT MD VISIT: Dr Kennyth on 06/24/24  OBJECTIVE:  Note: Objective measures were completed at Evaluation unless otherwise noted.  DIAGNOSTIC FINDINGS:  Cervical MRI on 03/31/2024: IMPRESSION: 1. Cervical spondylosis as outlined within the body of the report. 2. At C5-C6, there is moderate disc degeneration. Disc bulge with endplate spurring and left greater than right uncovertebral hypertrophy. Superimposed small central disc protrusion. Mild spinal canal stenosis. The disc protrusion mildly flattens the ventral aspect of the spinal cord. Bilateral neural foraminal narrowing (moderate right, severe left). 3. At C6-C7, there is moderate disc degeneration. Shallow disc bulge. Superimposed broad-based left center/foraminal disc protrusion. No significant spinal canal stenosis. The disc protrusion contributes to severe left neural foraminal narrowing. 4. No more than mild spinal canal narrowing, and no significant foraminal narrowing, at the remaining cervical levels. 5. Mild multilevel  facet arthropathy.  Cervical Radiograph on 03/29/24: IMPRESSION: No acute displaced fracture or traumatic listhesis of the cervical spine.  PATIENT SURVEYS:  NDI:  NECK DISABILITY INDEX  Score Date: 05/03/2024  Pain intensity 2 = The pain is moderate at the moment  2. Personal care (washing, dressing, etc.) 1 =  I can look after myself normally but it causes extra pain  3. Lifting 2 = Pain prevents me lifting heavy weights off the floor, but I can manage if they are  conveniently placed, for example on a table  4. Reading 3 = I can't read as much as I want because of moderate pain in my neck  5. Headaches 0 = I have no headaches at all  6. Concentration 1 =  I can concentrate fully when I want to with slight difficulty   7. Work 2 = I can do most of my usual work, but no more  8. Driving 3 = I can't drive my car as long as I want because of moderate pain in my neck  9. Sleeping 2 = My sleep is mildly disturbed (1-2 hrs sleepless)  10. Recreation 3 = I am able to engage in a few of my usual recreation activities because of pain in   my neck  Total 19/50 = 38%   Minimum Detectable Change (90% confidence): 5 points or 10% points  COGNITION: Overall cognitive status: Within functional limits for tasks assessed  SENSATION: Pt reports numbness and tingling down left arm  POSTURE: rounded shoulders and forward head  PALPATION: Patient with increased muscle tension/spasms noted along bilateral cervical paraspinals, upper traps, and rhomboids   CERVICAL ROM:   Active ROM A/ROM (deg) eval  Flexion 44  Extension 40  Right lateral flexion 40  Left lateral flexion 35  Right rotation 45  Left rotation 45   (Blank rows = not tested)  UPPER EXTREMITY ROM:  WFL  UPPER EXTREMITY MMT:  MMT Right eval Left eval  Shoulder flexion 5 5-  Shoulder extension    Shoulder abduction 5 4+  Shoulder adduction    Shoulder internal rotation 4+ 4-  Shoulder external rotation 4+ 4-  Middle  trapezius    Lower trapezius    Grip strength 49 45   (  Blank rows = not tested)  CERVICAL SPECIAL TESTS:  Distraction test: patient reports less pain  FUNCTIONAL TESTS:  Eval:  5 times sit to stand: 6.28 sec  TREATMENT DATE:  05/19/2024: UBE level 1.0 x3 min each direction with PT present to discuss status (no numbness today) Cervical snags in extension 1x8 Standing median nerve stretch x5 bilat Seated cervical retraction into ball 2x10 with 5 sec hold Seated thoracic extension with ball behind back x15 Seated shoulder ER with red tband 2x10 Seated shoulder horizontal abduction with red tband 2x10 Standing lower trap lift off 2x10 Standing rows with green tband 2x10 Standing shoulder ER with red tband x10 bilat   05/13/24 UBE fwd/bwd PT monitored to discuss pt status and posture - experiences increased numbness with fwd, no numbness bwd ULTT - all positive (radial worst) Cervical snags in extension 1x8- she likes this one Seated chin tuck 5 sec hold x 0 Standing Shoulder abduction with red tband  2x10 Standing Shoulder ER with red tband 2x10 Seated row 25# x 10 Standing 3 way raises 3# x 10 ea; also reviewed with red band for HEP Single arm IR and ER with red x 10 ea (added to HEP) Attempted manual traction and self manual traction with strap- mild relief   05/13/24 Nustep level 3 for 5 min- PT student monitored to discuss pt status Posterior pelvic tilts 3x8 for postural support Cervical snags in extension 2x8- she likes this one Shoulder abduction with red tband in seated 2x10 Shoulder ER with red tband in seated 2x15 Sleeper's stretch triggered pain within sec so asked pt to lie on back and pain subsided Twisties x15 Flex/ext both wrists for forearm muscle activation to improve grip strength Levator scap stretch 2x20 sec - she states this felt good    PATIENT EDUCATION:  Education details: Issued HEP Person educated: Patient Education method: Explanation,  Demonstration, and Handouts Education comprehension: verbalized understanding and returned demonstration  HOME EXERCISE PROGRAM: Access Code: [email or web address] URL: [email or web address] Date: 05/19/2024 Prepared by: Jarrell Laming  Exercises - Seated Upper Trapezius Stretch  - 1 x daily - 7 x weekly - 2 reps - 20 sec hold - Seated Levator Scapulae Stretch  - 1 x daily - 7 x weekly - 2 reps - 20 sec hold - Seated Cervical Retraction  - 1 x daily - 7 x weekly - 2 sets - 10 reps - Mid-Lower Cervical Extension SNAG with Strap  - 1 x daily - 7 x weekly - 2 sets - 10 reps - Shoulder External Rotation and Scapular Retraction with Resistance  - 1 x daily - 7 x weekly - 2 sets - 10 reps - Standing Shoulder Horizontal Abduction with Resistance  - 1 x daily - 7 x weekly - 2 sets - 10 reps - Shoulder Internal Rotation with Resistance  - 1 x daily - 3 x weekly - 2 sets - 10 reps - Shoulder External Rotation with Anchored Resistance  - 1 x daily - 3 x weekly - 2 sets - 10 reps - Standing Shoulder Flexion with Resistance  - 1 x daily - 3 x weekly - 2 sets - 10 reps - Scaption with Resistance  - 1 x daily - 3 x weekly - 2 sets - 10 reps - Standing Single Arm Shoulder Abduction with Resistance  - 1 x daily - 3 x weekly - 2 sets - 10 reps - Standing Median Nerve Glide  - 1 x daily - 7 x weekly -  2 sets - 5 reps  ASSESSMENT:  CLINICAL IMPRESSION: Ms Inch presents to skilled PT reporting that she is doing well with her new exercises.  Patient states that she was out doing yard work earlier and does have some soreness as a result.  Patient started to have some numbness with SNAGs, so educated patient on median nerve stretch and patient reported improvement of symptoms.  Added median nerve glide to HEP, as she reports that it helped with her numbness symptoms.  Patient reported that the cervical retraction against ball felt good, as well as seated thoracic extension.  Patient continues to progress  towards goal related activities and educated about the importance of continued improved posture, especially during exercises.  OBJECTIVE IMPAIRMENTS: decreased strength, increased fascial restrictions, increased muscle spasms, impaired sensation, postural dysfunction, and pain.   ACTIVITY LIMITATIONS: carrying, lifting, and sleeping  PARTICIPATION LIMITATIONS: cleaning, driving, and community activity  PERSONAL FACTORS: Past/current experiences, Time since onset of injury/illness/exacerbation, and 1 comorbidity: HTN are also affecting patient's functional outcome.   REHAB POTENTIAL: Good  CLINICAL DECISION MAKING: Stable/uncomplicated  EVALUATION COMPLEXITY: Low   GOALS: Goals reviewed with patient? Yes  SHORT TERM GOALS: Target date: 05/28/2024  Patient will be independent with initial HEP. Baseline:  Goal status: Met on 05/19/24  2.  Patient will report at least a 25% improvement in symptoms since starting PT. Baseline:  Goal status: Ongoing   LONG TERM GOALS: Target date: 06/25/2024  Patient will be independent with advanced HEP to allow for self progression after discharge. Baseline:  Goal status: INITIAL  2.  Patient will report ability to play pickle-ball without increased pain. Baseline:  Goal status: INITIAL  3.  Patient will increase left grip strength to at least 50# to allow her to open tight jars. Baseline: 45# Goal status: INITIAL  4.  Patient will improve Neck Disability Index to no greater than 20% to demonstrate improvements in functional tasks. Baseline: 38% Goal status: INITIAL  5.  Patient will increase left shoulder strength to at least 4+ to 5-/5 to allow her to perform more advanced tasks at home, such as cleaning and performing yard work. Baseline:  Goal status: INITIAL   PLAN:  PT FREQUENCY: 1-2x/week  PT DURATION: 8 weeks  PLANNED INTERVENTIONS: 97164- PT Re-evaluation, 97750- Physical Performance Testing, 97110-Therapeutic exercises,  97530- Therapeutic activity, 97112- Neuromuscular re-education, 97535- Self Care, 02859- Manual therapy, [phone]- Canalith repositioning, [id number]- Aquatic Therapy, [phone]- Electrical stimulation (unattended), [phone]- Electrical stimulation (manual), [id number]- Vasopneumatic device, [id number]- Ultrasound, [id number]- Traction (mechanical), [id number]- Ionotophoresis 4mg /ml Dexamethasone, 20560 (1-2 muscles), 20561 (3+ muscles)- Dry Needling, Patient/Family education, Balance training, Taping, Joint mobilization, Joint manipulation, Spinal manipulation, Spinal mobilization, Vestibular training, Cryotherapy, and Moist heat  PLAN FOR NEXT SESSION: Assess and progress HEP as indicated, strengthening, flexibility, manual/dry needling as indicated, AROM shoulder retraction, Standing Rows   Jarrell Laming, PT, DPT 05/19/24, 11:52 AM  [facility] [address], [address] [place], [place] [address] Phone # [phone] Fax [phone]

## 2024-05-24 ENCOUNTER — Encounter: Payer: Self-pay | Admitting: Physical Therapy

## 2024-05-24 ENCOUNTER — Ambulatory Visit: Admitting: Physical Therapy

## 2024-05-24 DIAGNOSIS — M5412 Radiculopathy, cervical region: Secondary | ICD-10-CM | POA: Diagnosis not present

## 2024-05-24 DIAGNOSIS — M6281 Muscle weakness (generalized): Secondary | ICD-10-CM

## 2024-05-24 DIAGNOSIS — R293 Abnormal posture: Secondary | ICD-10-CM

## 2024-05-24 DIAGNOSIS — R252 Cramp and spasm: Secondary | ICD-10-CM

## 2024-05-24 NOTE — Patient Instructions (Signed)

## 2024-05-24 NOTE — Therapy (Signed)
 OUTPATIENT PHYSICAL THERAPY TREATMENT NOTE   Patient Name: Rachael Green MRN: [id number] DOB:January 11, 1966, 58 y.o., female Today's Date: 05/24/2024  END OF SESSION:  PT End of Session - 05/24/24 0849     Visit Number 5    Date for PT Re-Evaluation 06/25/24    Authorization Type [place] [employer]    PT Start Time 0845    PT Stop Time [phone]    PT Time Calculation (min) 44 min    Activity Tolerance Patient tolerated treatment well    Behavior During Therapy [facility] for tasks assessed/performed             Past Medical History:  Diagnosis Date   Allergy    Anxiety    Depression    Diverticulosis    GERD (gastroesophageal reflux disease)    Hypertension    Thyroid  disease    Past Surgical History:  Procedure Laterality Date   ARTHROSCOPIC REPAIR ACL     left   WISDOM TOOTH EXTRACTION     Patient Active Problem List   Diagnosis Date Noted   Left cervical radiculopathy 03/29/2024   Left knee pain 11/29/2021   Glaucoma 02/07/2021   Hyperglycemia 03/11/2019   Essential hypertension 11/11/2018   Hypothyroidism 11/11/2018   Depression, major, in remission (HCC) 11/11/2018   Anxiety 11/11/2018   GERD (gastroesophageal reflux disease) 11/11/2018   Dyslipidemia 11/11/2018   Allergic rhinitis 11/11/2018   Hearing loss 11/11/2018   OSA (obstructive sleep apnea) 11/11/2018   Steatosis of liver 01/09/2018   Lipoma of upper arm 05/10/2016   Insomnia 03/17/2012    PCP: Kennyth Worth HERO, MD  REFERRING PROVIDER: Claudene Arthea HERO, DO  REFERRING DIAG: [phone] (ICD-10-CM) - Left cervical radiculopathy  THERAPY DIAG:  Muscle weakness (generalized)  Cramp and spasm  Radiculopathy, cervical region  Abnormal posture  Rationale for Evaluation and Treatment: Rehabilitation  ONSET DATE: severely for June 2025  SUBJECTIVE:                                                                                                                                                                                                          SUBJECTIVE STATEMENT: It was doing really well, but then driving in I got the tingling.  Hand dominance: Left  PERTINENT HISTORY:  HTN.  Cervical radiculopathy with epidural injection in July 2025.  PAIN:  Are you having pain? Yes: NPRS scale: 2/10 Pain location: cervical and radiating down left arm Pain description: needles/numb feeling Aggravating factors: unknown Relieving factors: sometimes a position change  PRECAUTIONS: None  RED  FLAGS: None     WEIGHT BEARING RESTRICTIONS: No  FALLS:  Has patient fallen in last 6 months? No  LIVING ENVIRONMENT: Lives with: lives with their spouse Lives in: House/apartment Stairs: split level home Has following equipment at home: None  OCCUPATION: Retired  PLOF: Independent and Leisure: Pickle-ball  PATIENT GOALS: To return to playing pickle-ball and be able to work out at [employer].  NEXT MD VISIT: Dr Kennyth on 06/24/24  OBJECTIVE:  Note: Objective measures were completed at Evaluation unless otherwise noted.  DIAGNOSTIC FINDINGS:  Cervical MRI on 03/31/2024: IMPRESSION: 1. Cervical spondylosis as outlined within the body of the report. 2. At C5-C6, there is moderate disc degeneration. Disc bulge with endplate spurring and left greater than right uncovertebral hypertrophy. Superimposed small central disc protrusion. Mild spinal canal stenosis. The disc protrusion mildly flattens the ventral aspect of the spinal cord. Bilateral neural foraminal narrowing (moderate right, severe left). 3. At C6-C7, there is moderate disc degeneration. Shallow disc bulge. Superimposed broad-based left center/foraminal disc protrusion. No significant spinal canal stenosis. The disc protrusion contributes to severe left neural foraminal narrowing. 4. No more than mild spinal canal narrowing, and no significant foraminal narrowing, at the remaining cervical levels. 5. Mild multilevel facet  arthropathy.  Cervical Radiograph on 03/29/24: IMPRESSION: No acute displaced fracture or traumatic listhesis of the cervical spine.  PATIENT SURVEYS:  NDI:  NECK DISABILITY INDEX  Score Date: 05/03/2024  Pain intensity 2 = The pain is moderate at the moment  2. Personal care (washing, dressing, etc.) 1 =  I can look after myself normally but it causes extra pain  3. Lifting 2 = Pain prevents me lifting heavy weights off the floor, but I can manage if they are  conveniently placed, for example on a table  4. Reading 3 = I can't read as much as I want because of moderate pain in my neck  5. Headaches 0 = I have no headaches at all  6. Concentration 1 =  I can concentrate fully when I want to with slight difficulty   7. Work 2 = I can do most of my usual work, but no more  8. Driving 3 = I can't drive my car as long as I want because of moderate pain in my neck  9. Sleeping 2 = My sleep is mildly disturbed (1-2 hrs sleepless)  10. Recreation 3 = I am able to engage in a few of my usual recreation activities because of pain in   my neck  Total 19/50 = 38%   Minimum Detectable Change (90% confidence): 5 points or 10% points  COGNITION: Overall cognitive status: Within functional limits for tasks assessed  SENSATION: Pt reports numbness and tingling down left arm  POSTURE: rounded shoulders and forward head  PALPATION: Patient with increased muscle tension/spasms noted along bilateral cervical paraspinals, upper traps, and rhomboids   CERVICAL ROM:   Active ROM A/ROM (deg) eval  Flexion 44  Extension 40  Right lateral flexion 40  Left lateral flexion 35  Right rotation 45  Left rotation 45   (Blank rows = not tested)  UPPER EXTREMITY ROM:  WFL  UPPER EXTREMITY MMT:  MMT Right eval Left eval  Shoulder flexion 5 5-  Shoulder extension    Shoulder abduction 5 4+  Shoulder adduction    Shoulder internal rotation 4+ 4-  Shoulder external rotation 4+ 4-  Middle  trapezius    Lower trapezius    Grip strength 49 45   (  Blank rows = not tested)  CERVICAL SPECIAL TESTS:  Distraction test: patient reports less pain  FUNCTIONAL TESTS:  Eval:  5 times sit to stand: 6.28 sec  TREATMENT DATE:  05/24/2024: UBE level 1.0 x5 min  with PT present to discuss status - pt did not tolerate fwd very well today. Reverse was better. Standing median nerve stretch x 10 L DN education and aftercare Cervical ROM post DN  Trigger Point Dry Needling  Initial Treatment: Pt instructed on Dry Needling rational, procedures, and possible side effects. Pt instructed to expect mild to moderate muscle soreness later in the day and/or into the next day.  Pt instructed in methods to reduce muscle soreness. Pt instructed to continue prescribed HEP. Because Dry Needling was performed over or adjacent to a lung field, pt was educated on S/S of pneumothorax and to seek immediate medical attention should they occur.  Patient was educated on signs and symptoms of infection and other risk factors and advised to seek medical attention should they occur.  Patient verbalized understanding of these instructions and education.   Patient Verbal Consent Given: Yes Education Handout Provided: Yes Muscles Treated: L pecs, UT, cervical multifidi, biceps , wrist extensors, deltoids Electrical Stimulation Performed: No Treatment Response/Outcome: Utilized skilled palpation to identify bony landmarks and trigger points.  Able to illicit twitch response and muscle elongation.  Soft tissue mobilization to muscles needled to further promote tissue elongation and decreased pain.    PA mobs to lower cervical and upper thoracic spine    05/19/2024: UBE level 1.0 x3 min each direction with PT present to discuss status (no numbness today) Cervical snags in extension 1x8 Standing median nerve stretch x5 bilat Seated cervical retraction into ball 2x10 with 5 sec hold Seated thoracic extension with  ball behind back x15 Seated shoulder ER with red tband 2x10 Seated shoulder horizontal abduction with red tband 2x10 Standing lower trap lift off 2x10 Standing rows with green tband 2x10 Standing shoulder ER with red tband x10 bilat   05/13/24 UBE fwd/bwd PT monitored to discuss pt status and posture - experiences increased numbness with fwd, no numbness bwd ULTT - all positive (radial worst) Cervical snags in extension 1x8- she likes this one Seated chin tuck 5 sec hold x 0 Standing Shoulder abduction with red tband  2x10 Standing Shoulder ER with red tband 2x10 Seated row 25# x 10 Standing 3 way raises 3# x 10 ea; also reviewed with red band for HEP Single arm IR and ER with red x 10 ea (added to HEP) Attempted manual traction and self manual traction with strap- mild relief    PATIENT EDUCATION:  Education details: Issued HEP Person educated: Patient Education method: Explanation, Demonstration, and Handouts Education comprehension: verbalized understanding and returned demonstration  HOME EXERCISE PROGRAM: Access Code: [email or web address] URL: [email or web address] Date: 05/19/2024 Prepared by: Jarrell Laming  Exercises - Seated Upper Trapezius Stretch  - 1 x daily - 7 x weekly - 2 reps - 20 sec hold - Seated Levator Scapulae Stretch  - 1 x daily - 7 x weekly - 2 reps - 20 sec hold - Seated Cervical Retraction  - 1 x daily - 7 x weekly - 2 sets - 10 reps - Mid-Lower Cervical Extension SNAG with Strap  - 1 x daily - 7 x weekly - 2 sets - 10 reps - Shoulder External Rotation and Scapular Retraction with Resistance  - 1 x daily - 7 x weekly - 2 sets - 10 reps -  Standing Shoulder Horizontal Abduction with Resistance  - 1 x daily - 7 x weekly - 2 sets - 10 reps - Shoulder Internal Rotation with Resistance  - 1 x daily - 3 x weekly - 2 sets - 10 reps - Shoulder External Rotation with Anchored Resistance  - 1 x daily - 3 x weekly - 2 sets - 10 reps - Standing Shoulder  Flexion with Resistance  - 1 x daily - 3 x weekly - 2 sets - 10 reps - Scaption with Resistance  - 1 x daily - 3 x weekly - 2 sets - 10 reps - Standing Single Arm Shoulder Abduction with Resistance  - 1 x daily - 3 x weekly - 2 sets - 10 reps - Standing Median Nerve Glide  - 1 x daily - 7 x weekly - 2 sets - 5 reps  ASSESSMENT:  CLINICAL IMPRESSION: Patient reports improvement since last visit until today. She went to the gym and did chest presses and overhead lifts. She has palpable increased muscle tension in the L pectoralis muscles and palpation causes increased tingling into her L UE. She requested a trial of DN today. She had excellent responses in the pecs, biceps, UT and cervical multifidi. She reported immediate decrease in tightness in her neck and shoulders at end of session. Patient had to cancel next appointment and will return next week.  OBJECTIVE IMPAIRMENTS: decreased strength, increased fascial restrictions, increased muscle spasms, impaired sensation, postural dysfunction, and pain.   ACTIVITY LIMITATIONS: carrying, lifting, and sleeping  PARTICIPATION LIMITATIONS: cleaning, driving, and community activity  PERSONAL FACTORS: Past/current experiences, Time since onset of injury/illness/exacerbation, and 1 comorbidity: HTN are also affecting patient's functional outcome.   REHAB POTENTIAL: Good  CLINICAL DECISION MAKING: Stable/uncomplicated  EVALUATION COMPLEXITY: Low   GOALS: Goals reviewed with patient? Yes  SHORT TERM GOALS: Target date: 05/28/2024  Patient will be independent with initial HEP. Baseline:  Goal status: Met on 05/19/24  2.  Patient will report at least a 25% improvement in symptoms since starting PT. Baseline:  Goal status: Ongoing   LONG TERM GOALS: Target date: 06/25/2024  Patient will be independent with advanced HEP to allow for self progression after discharge. Baseline:  Goal status: INITIAL  2.  Patient will report ability to play  pickle-ball without increased pain. Baseline:  Goal status: INITIAL  3.  Patient will increase left grip strength to at least 50# to allow her to open tight jars. Baseline: 45# Goal status: INITIAL  4.  Patient will improve Neck Disability Index to no greater than 20% to demonstrate improvements in functional tasks. Baseline: 38% Goal status: INITIAL  5.  Patient will increase left shoulder strength to at least 4+ to 5-/5 to allow her to perform more advanced tasks at home, such as cleaning and performing yard work. Baseline:  Goal status: INITIAL   PLAN:  PT FREQUENCY: 1-2x/week  PT DURATION: 8 weeks  PLANNED INTERVENTIONS: 97164- PT Re-evaluation, 97750- Physical Performance Testing, 97110-Therapeutic exercises, 97530- Therapeutic activity, [id number]- Neuromuscular re-education, 97535- Self Care, [id number]- Manual therapy, [phone]- Canalith repositioning, [id number]- Aquatic Therapy, H9716- Electrical stimulation (unattended), [phone]- Electrical stimulation (manual), [id number]- Vasopneumatic device, [id number]- Ultrasound, [id number]- Traction (mechanical), [id number]- Ionotophoresis 4mg /ml Dexamethasone, [id number] (1-2 muscles), [id number] (3+ muscles)- Dry Needling, Patient/Family education, Balance training, Taping, Joint mobilization, Joint manipulation, Spinal manipulation, Spinal mobilization, Vestibular training, Cryotherapy, and Moist heat  PLAN FOR NEXT SESSION: Assess response to DN, strengthening, flexibility, manual/dry needling as indicated, AROM shoulder retraction, Standing Rows  Mliss Cummins, PT  05/24/24, 2:18 PM  [facility] [address] [place], [place] [address] Phone # [phone] Fax [phone]

## 2024-05-26 ENCOUNTER — Encounter: Admitting: Rehabilitative and Restorative Service Providers"

## 2024-06-02 ENCOUNTER — Encounter: Payer: Self-pay | Admitting: Physical Therapy

## 2024-06-02 ENCOUNTER — Ambulatory Visit: Attending: Family Medicine | Admitting: Physical Therapy

## 2024-06-02 DIAGNOSIS — R293 Abnormal posture: Secondary | ICD-10-CM | POA: Insufficient documentation

## 2024-06-02 DIAGNOSIS — M6281 Muscle weakness (generalized): Secondary | ICD-10-CM | POA: Diagnosis not present

## 2024-06-02 DIAGNOSIS — M5412 Radiculopathy, cervical region: Secondary | ICD-10-CM | POA: Diagnosis not present

## 2024-06-02 DIAGNOSIS — R252 Cramp and spasm: Secondary | ICD-10-CM | POA: Diagnosis not present

## 2024-06-02 NOTE — Therapy (Signed)
 OUTPATIENT PHYSICAL THERAPY TREATMENT NOTE   Patient Name: Rachael Green MRN: [id number] DOB:January 23, 1966, 58 y.o., female Today's Date: 06/02/2024  END OF SESSION:  PT End of Session - 06/02/24 0939     Visit Number 6    Date for PT Re-Evaluation 06/25/24    Authorization Type [employer]    PT Start Time [phone]    PT Stop Time 1014    PT Time Calculation (min) 39 min    Activity Tolerance Patient tolerated treatment well    Behavior During Therapy [facility] for tasks assessed/performed              Past Medical History:  Diagnosis Date   Allergy    Anxiety    Depression    Diverticulosis    GERD (gastroesophageal reflux disease)    Hypertension    Thyroid  disease    Past Surgical History:  Procedure Laterality Date   ARTHROSCOPIC REPAIR ACL     left   WISDOM TOOTH EXTRACTION     Patient Active Problem List   Diagnosis Date Noted   Left cervical radiculopathy 03/29/2024   Left knee pain 11/29/2021   Glaucoma 02/07/2021   Hyperglycemia 03/11/2019   Essential hypertension 11/11/2018   Hypothyroidism 11/11/2018   Depression, major, in remission (HCC) 11/11/2018   Anxiety 11/11/2018   GERD (gastroesophageal reflux disease) 11/11/2018   Dyslipidemia 11/11/2018   Allergic rhinitis 11/11/2018   Hearing loss 11/11/2018   OSA (obstructive sleep apnea) 11/11/2018   Steatosis of liver 01/09/2018   Lipoma of upper arm 05/10/2016   Insomnia 03/17/2012    PCP: Kennyth Worth HERO, MD  REFERRING PROVIDER: Claudene Arthea HERO, DO  REFERRING DIAG: [phone] (ICD-10-CM) - Left cervical radiculopathy  THERAPY DIAG:  Muscle weakness (generalized)  Cramp and spasm  Radiculopathy, cervical region  Abnormal posture  Rationale for Evaluation and Treatment: Rehabilitation  ONSET DATE: severely for June 2025  SUBJECTIVE:                                                                                                                                                                                                          SUBJECTIVE STATEMENT: 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 I had no tingling for a week.  Yesterday it started tingling again when I was driving. Holding a book.  Hand dominance: Left  PERTINENT HISTORY:  HTN.  Cervical radiculopathy with epidural injection in July 2025.  PAIN:  Are you having pain? Yes: NPRS scale: 2/10 Pain location: cervical and radiating down left arm Pain description: needles/numb feeling  Aggravating factors: unknown Relieving factors: sometimes a position change  PRECAUTIONS: None  RED FLAGS: None     WEIGHT BEARING RESTRICTIONS: No  FALLS:  Has patient fallen in last 6 months? No  LIVING ENVIRONMENT: Lives with: lives with their spouse Lives in: House/apartment Stairs: split level home Has following equipment at home: None  OCCUPATION: Retired  PLOF: Independent and Leisure: Pickle-ball  PATIENT GOALS: To return to playing pickle-ball and be able to work out at [employer].  NEXT MD VISIT: Dr Kennyth on 06/24/24  OBJECTIVE:  Note: Objective measures were completed at Evaluation unless otherwise noted.  DIAGNOSTIC FINDINGS:  Cervical MRI on 03/31/2024: IMPRESSION: 1. Cervical spondylosis as outlined within the body of the report. 2. At C5-C6, there is moderate disc degeneration. Disc bulge with endplate spurring and left greater than right uncovertebral hypertrophy. Superimposed small central disc protrusion. Mild spinal canal stenosis. The disc protrusion mildly flattens the ventral aspect of the spinal cord. Bilateral neural foraminal narrowing (moderate right, severe left). 3. At C6-C7, there is moderate disc degeneration. Shallow disc bulge. Superimposed broad-based left center/foraminal disc protrusion. No significant spinal canal stenosis. The disc protrusion contributes to severe left neural foraminal narrowing. 4. No more than mild spinal canal narrowing, and no significant foraminal narrowing,  at the remaining cervical levels. 5. Mild multilevel facet arthropathy.  Cervical Radiograph on 03/29/24: IMPRESSION: No acute displaced fracture or traumatic listhesis of the cervical spine.  PATIENT SURVEYS:  NDI:  NECK DISABILITY INDEX  Score Date: 05/03/2024  Pain intensity 2 = The pain is moderate at the moment  2. Personal care (washing, dressing, etc.) 1 =  I can look after myself normally but it causes extra pain  3. Lifting 2 = Pain prevents me lifting heavy weights off the floor, but I can manage if they are  conveniently placed, for example on a table  4. Reading 3 = I can't read as much as I want because of moderate pain in my neck  5. Headaches 0 = I have no headaches at all  6. Concentration 1 =  I can concentrate fully when I want to with slight difficulty   7. Work 2 = I can do most of my usual work, but no more  8. Driving 3 = I can't drive my car as long as I want because of moderate pain in my neck  9. Sleeping 2 = My sleep is mildly disturbed (1-2 hrs sleepless)  10. Recreation 3 = I am able to engage in a few of my usual recreation activities because of pain in   my neck  Total 19/50 = 38%   Minimum Detectable Change (90% confidence): 5 points or 10% points  COGNITION: Overall cognitive status: Within functional limits for tasks assessed  SENSATION: Pt reports numbness and tingling down left arm  POSTURE: rounded shoulders and forward head  PALPATION: Patient with increased muscle tension/spasms noted along bilateral cervical paraspinals, upper traps, and rhomboids   CERVICAL ROM:   Active ROM A/ROM (deg) eval  Flexion 44  Extension 40  Right lateral flexion 40  Left lateral flexion 35  Right rotation 45  Left rotation 45   (Blank rows = not tested)  UPPER EXTREMITY ROM:  [facility]  UPPER EXTREMITY MMT:  MMT Right eval Left eval Right 06/02/24 Left  06/02/24  Shoulder flexion 5 5-  5  Shoulder extension      Shoulder abduction 5 4+  5  Shoulder  adduction  Shoulder internal rotation 4+ 4-  5  Shoulder external rotation 4+ 4-  5  Middle trapezius   5 4-  Lower trapezius   4 4-  Grip strength 49 45     (Blank rows = not tested)  CERVICAL SPECIAL TESTS:  Distraction test: patient reports less pain  FUNCTIONAL TESTS:  Eval:  5 times sit to stand: 6.28 sec  TREATMENT DATE:  06/02/2024: Discussion of POC  UBE level 1.0 x6 min  with PT present to discuss status - pt did not tolerate fwd very well today. Reverse was better. MMT Prone T and Y 1# wt x 20 B Prone Y to W to Y x 5 Seated ulnar nerve glide x 10 (improves with reps) Seated median nerve stretch x 10 L Standing rows with green tband 2x10 Standing horizontal ABD green  x 20 Standing shoulder extension green band x 20    05/24/2024: UBE level 1.0 x5 min  with PT present to discuss status - pt did not tolerate fwd very well today. Reverse was better. Standing median nerve stretch x 10 L DN education and aftercare Cervical ROM post DN  Trigger Point Dry Needling  Initial Treatment: Pt instructed on Dry Needling rational, procedures, and possible side effects. Pt instructed to expect mild to moderate muscle soreness later in the day and/or into the next day.  Pt instructed in methods to reduce muscle soreness. Pt instructed to continue prescribed HEP. Because Dry Needling was performed over or adjacent to a lung field, pt was educated on S/S of pneumothorax and to seek immediate medical attention should they occur.  Patient was educated on signs and symptoms of infection and other risk factors and advised to seek medical attention should they occur.  Patient verbalized understanding of these instructions and education.   Patient Verbal Consent Given: Yes Education Handout Provided: Yes Muscles Treated: L pecs, UT, cervical multifidi, biceps , wrist extensors, deltoids Electrical Stimulation Performed: No Treatment Response/Outcome: Utilized skilled palpation to  identify bony landmarks and trigger points.  Able to illicit twitch response and muscle elongation.  Soft tissue mobilization to muscles needled to further promote tissue elongation and decreased pain.    PA mobs to lower cervical and upper thoracic spine    05/19/2024: UBE level 1.0 x3 min each direction with PT present to discuss status (no numbness today) Cervical snags in extension 1x8 Standing median nerve stretch x5 bilat Seated cervical retraction into ball 2x10 with 5 sec hold Seated thoracic extension with ball behind back x15 Seated shoulder ER with red tband 2x10 Seated shoulder horizontal abduction with red tband 2x10 Standing lower trap lift off 2x10 Standing rows with green tband 2x10 Standing shoulder ER with red tband x10 bilat   05/13/24 UBE fwd/bwd PT monitored to discuss pt status and posture - experiences increased numbness with fwd, no numbness bwd ULTT - all positive (radial worst) Cervical snags in extension 1x8- she likes this one Seated chin tuck 5 sec hold x 0 Standing Shoulder abduction with red tband  2x10 Standing Shoulder ER with red tband 2x10 Seated row 25# x 10 Standing 3 way raises 3# x 10 ea; also reviewed with red band for HEP Single arm IR and ER with red x 10 ea (added to HEP) Attempted manual traction and self manual traction with strap- mild relief    PATIENT EDUCATION:  Education details: Issued HEP Person educated: Patient Education method: Explanation, Demonstration, and Handouts Education comprehension: verbalized understanding and returned demonstration  HOME EXERCISE PROGRAM: Access Code: [email or web address] URL: [email or web address] Date: 06/02/2024 Prepared by: Mliss  Exercises - Seated Upper Trapezius Stretch  - 1 x daily - 7 x weekly - 2 reps - 20 sec hold - Seated Levator Scapulae Stretch  - 1 x daily - 7 x weekly - 2 reps - 20 sec hold - Seated Cervical Retraction  - 1 x daily - 7 x weekly - 2 sets - 10 reps -  Mid-Lower Cervical Extension SNAG with Strap  - 1 x daily - 7 x weekly - 2 sets - 10 reps - Shoulder External Rotation and Scapular Retraction with Resistance  - 1 x daily - 7 x weekly - 2 sets - 10 reps - Standing Shoulder Horizontal Abduction with Resistance  - 1 x daily - 7 x weekly - 2 sets - 10 reps - Shoulder Internal Rotation with Resistance  - 1 x daily - 3 x weekly - 2 sets - 10 reps - Shoulder External Rotation with Anchored Resistance  - 1 x daily - 3 x weekly - 2 sets - 10 reps - Standing Shoulder Flexion with Resistance  - 1 x daily - 3 x weekly - 2 sets - 10 reps - Scaption with Resistance  - 1 x daily - 3 x weekly - 2 sets - 10 reps - Standing Single Arm Shoulder Abduction with Resistance  - 1 x daily - 3 x weekly - 2 sets - 10 reps - Standing Median Nerve Glide  - 1 x daily - 7 x weekly - 1 sets - 10 reps - Standing Ulnar Nerve Glide  - 1 x daily - 7 x weekly - 1 sets - 10 reps - Prone Scapular Retraction Y  - 1 x daily - 3 x weekly - 2 sets - 10 reps  ASSESSMENT:  CLINICAL IMPRESSION:  Patient reports marked relief (no tingling) x 8 days since DN. She reports she has been non-compliant with exercises due to a home issue she is dealing with. She began getting tingling yesterday with driving and feels it when she is holding a book, but otherwise no tingling. She has full strength in her Left shoulder now, but still has weakness in her mid and low traps bilaterally. HEP progressed with prone Y's and ulnar nerve glide. Nerve tension improved with reps for both the median and ulnar nerve today.  Patient has not attempted to play pickleball. She continues to demonstrate potential for improvement and would benefit from continued skilled therapy to address impairments.    OBJECTIVE IMPAIRMENTS: decreased strength, increased fascial restrictions, increased muscle spasms, impaired sensation, postural dysfunction, and pain.   ACTIVITY LIMITATIONS: carrying, lifting, and  sleeping  PARTICIPATION LIMITATIONS: cleaning, driving, and community activity  PERSONAL FACTORS: Past/current experiences, Time since onset of injury/illness/exacerbation, and 1 comorbidity: HTN are also affecting patient's functional outcome.   REHAB POTENTIAL: Good  CLINICAL DECISION MAKING: Stable/uncomplicated  EVALUATION COMPLEXITY: Low   GOALS: Goals reviewed with patient? Yes  SHORT TERM GOALS: Target date: 05/28/2024  Patient will be independent with initial HEP. Baseline:  Goal status: Met on 05/19/24  2.  Patient will report at least a 25% improvement in symptoms since starting PT. Baseline:  Goal status: MET 06/02/24    LONG TERM GOALS: Target date: 06/25/2024  Patient will be independent with advanced HEP to allow for self progression after discharge. Baseline:  Goal status: INITIAL  2.  Patient will report ability to play pickle-ball without increased pain. Baseline:  Goal status:  INITIAL  3.  Patient will increase left grip strength to at least 50# to allow her to open tight jars. Baseline: 45# Goal status: INITIAL  4.  Patient will improve Neck Disability Index to no greater than 20% to demonstrate improvements in functional tasks. Baseline: 38% Goal status: INITIAL  5.  Patient will increase left shoulder strength to at least 4+ to 5-/5 to allow her to perform more advanced tasks at home, such as cleaning and performing yard work. Baseline:  Goal status: MET   PLAN:  PT FREQUENCY: 1-2x/week  PT DURATION: 8 weeks  PLANNED INTERVENTIONS: 97164- PT Re-evaluation, 97750- Physical Performance Testing, 97110-Therapeutic exercises, 97530- Therapeutic activity, [id number]- Neuromuscular re-education, 97535- Self Care, [id number]- Manual therapy, [phone]- Canalith repositioning, [id number]- Aquatic Therapy, H9716- Electrical stimulation (unattended), [id number]- Electrical stimulation (manual), [id number]- Vasopneumatic device, [id number]- Ultrasound, [id number]- Traction (mechanical), [id number]-  Ionotophoresis 4mg /ml Dexamethasone, [id number] (1-2 muscles), 20561 (3+ muscles)- Dry Needling, Patient/Family education, Balance training, Taping, Joint mobilization, Joint manipulation, Spinal manipulation, Spinal mobilization, Vestibular training, Cryotherapy, and Moist heat  PLAN FOR NEXT SESSION:  DN as indicated, low trap/mid trap strengthening, assess ulnar and median nerve glides. Check grip strength.   Mliss Cummins, PT  06/02/24, 1:36 PM  [facility] [address] [place], [place] [address] Phone # [phone] Fax [phone]

## 2024-06-07 ENCOUNTER — Ambulatory Visit: Admitting: Rehabilitative and Restorative Service Providers"

## 2024-06-07 ENCOUNTER — Encounter: Payer: Self-pay | Admitting: Rehabilitative and Restorative Service Providers"

## 2024-06-07 DIAGNOSIS — M6281 Muscle weakness (generalized): Secondary | ICD-10-CM

## 2024-06-07 DIAGNOSIS — R252 Cramp and spasm: Secondary | ICD-10-CM

## 2024-06-07 DIAGNOSIS — M5412 Radiculopathy, cervical region: Secondary | ICD-10-CM | POA: Diagnosis not present

## 2024-06-07 DIAGNOSIS — R293 Abnormal posture: Secondary | ICD-10-CM | POA: Diagnosis not present

## 2024-06-07 NOTE — Therapy (Signed)
 OUTPATIENT PHYSICAL THERAPY TREATMENT NOTE   Patient Name: Rachael Green MRN: [id number] DOB:09/18/1966, 58 y.o., female Today's Date: 06/07/2024  END OF SESSION:  PT End of Session - 06/07/24 1150     Visit Number 7    Date for PT Re-Evaluation 06/25/24    Authorization Type [employer]    PT Start Time 1147    PT Stop Time 1225    PT Time Calculation (min) 38 min    Activity Tolerance Patient tolerated treatment well    Behavior During Therapy [facility] for tasks assessed/performed              Past Medical History:  Diagnosis Date   Allergy    Anxiety    Depression    Diverticulosis    GERD (gastroesophageal reflux disease)    Hypertension    Thyroid  disease    Past Surgical History:  Procedure Laterality Date   ARTHROSCOPIC REPAIR ACL     left   WISDOM TOOTH EXTRACTION     Patient Active Problem List   Diagnosis Date Noted   Left cervical radiculopathy 03/29/2024   Left knee pain 11/29/2021   Glaucoma 02/07/2021   Hyperglycemia 03/11/2019   Essential hypertension 11/11/2018   Hypothyroidism 11/11/2018   Depression, major, in remission (HCC) 11/11/2018   Anxiety 11/11/2018   GERD (gastroesophageal reflux disease) 11/11/2018   Dyslipidemia 11/11/2018   Allergic rhinitis 11/11/2018   Hearing loss 11/11/2018   OSA (obstructive sleep apnea) 11/11/2018   Steatosis of liver 01/09/2018   Lipoma of upper arm 05/10/2016   Insomnia 03/17/2012    PCP: Kennyth Worth HERO, MD  REFERRING PROVIDER: Claudene Arthea HERO, DO  REFERRING DIAG: [phone] (ICD-10-CM) - Left cervical radiculopathy  THERAPY DIAG:  Muscle weakness (generalized)  Cramp and spasm  Radiculopathy, cervical region  Abnormal posture  Rationale for Evaluation and Treatment: Rehabilitation  ONSET DATE: severely for June 2025  SUBJECTIVE:                                                                                                                                                                                                          SUBJECTIVE STATEMENT: Pt denies any current pain, states that only occassionally she has the tingling and it is very minor and goes away quickly.  Hand dominance: Left  PERTINENT HISTORY:  HTN.  Cervical radiculopathy with epidural injection in July 2025.  PAIN:  Are you having pain? Yes: NPRS scale: 0/10 Pain location: cervical and radiating down left arm Pain description: needles/numb feeling Aggravating factors: unknown Relieving  factors: sometimes a position change  PRECAUTIONS: None  RED FLAGS: None     WEIGHT BEARING RESTRICTIONS: No  FALLS:  Has patient fallen in last 6 months? No  LIVING ENVIRONMENT: Lives with: lives with their spouse Lives in: House/apartment Stairs: split level home Has following equipment at home: None  OCCUPATION: Retired  PLOF: Independent and Leisure: Pickle-ball  PATIENT GOALS: To return to playing pickle-ball and be able to work out at [employer].  NEXT MD VISIT: Dr Kennyth on 06/24/24  OBJECTIVE:  Note: Objective measures were completed at Evaluation unless otherwise noted.  DIAGNOSTIC FINDINGS:  Cervical MRI on 03/31/2024: IMPRESSION: 1. Cervical spondylosis as outlined within the body of the report. 2. At C5-C6, there is moderate disc degeneration. Disc bulge with endplate spurring and left greater than right uncovertebral hypertrophy. Superimposed small central disc protrusion. Mild spinal canal stenosis. The disc protrusion mildly flattens the ventral aspect of the spinal cord. Bilateral neural foraminal narrowing (moderate right, severe left). 3. At C6-C7, there is moderate disc degeneration. Shallow disc bulge. Superimposed broad-based left center/foraminal disc protrusion. No significant spinal canal stenosis. The disc protrusion contributes to severe left neural foraminal narrowing. 4. No more than mild spinal canal narrowing, and no significant foraminal narrowing, at the  remaining cervical levels. 5. Mild multilevel facet arthropathy.  Cervical Radiograph on 03/29/24: IMPRESSION: No acute displaced fracture or traumatic listhesis of the cervical spine.  PATIENT SURVEYS:  NDI:  NECK DISABILITY INDEX   Score Date: 05/03/2024 06/07/24  Pain intensity 2 = The pain is moderate at the moment 0  2. Personal care (washing, dressing, etc.) 1 =  I can look after myself normally but it causes extra pain 0  3. Lifting 2 = Pain prevents me lifting heavy weights off the floor, but I can manage if they are  conveniently placed, for example on a table 0  4. Reading 3 = I can't read as much as I want because of moderate pain in my neck 0  5. Headaches 0 = I have no headaches at all 1  6. Concentration 1 =  I can concentrate fully when I want to with slight difficulty  0  7. Work 2 = I can do most of my usual work, but no more 0  8. Driving 3 = I can't drive my car as long as I want because of moderate pain in my neck 0  9. Sleeping 2 = My sleep is mildly disturbed (1-2 hrs sleepless) 0  10. Recreation 3 = I am able to engage in a few of my usual recreation activities because of pain in   my neck 1  Total 19/50 = 38% 2/50 = 4.0%   Minimum Detectable Change (90% confidence): 5 points or 10% points  COGNITION: Overall cognitive status: Within functional limits for tasks assessed  SENSATION: Pt reports numbness and tingling down left arm  POSTURE: rounded shoulders and forward head  PALPATION: Patient with increased muscle tension/spasms noted along bilateral cervical paraspinals, upper traps, and rhomboids   CERVICAL ROM:   Active ROM A/ROM (deg) eval  Flexion 44  Extension 40  Right lateral flexion 40  Left lateral flexion 35  Right rotation 45  Left rotation 45   (Blank rows = not tested)  UPPER EXTREMITY ROM:  [facility]  UPPER EXTREMITY MMT:  MMT Right eval Left eval Right 06/02/24 Left  06/02/24  Shoulder flexion 5 5-  5  Shoulder extension       Shoulder abduction 5  4+  5  Shoulder adduction      Shoulder internal rotation 4+ 4-  5  Shoulder external rotation 4+ 4-  5  Middle trapezius   5 4-  Lower trapezius   4 4-  Grip strength 49 45     (Blank rows = not tested)  06/07/2024: Right: 50lbs Left:  50 lbs   CERVICAL SPECIAL TESTS:  Distraction test: patient reports less pain  FUNCTIONAL TESTS:  Eval:  5 times sit to stand: 6.28 sec  TREATMENT DATE:  06/07/2024: Seated sternocleidomastoid stretch 2x20 sec bilat Seated cervical retraction 2x10 Seated ulnar nerve glide x 10 Seated median nerve stretch x 10 Prone with 1# weight:  shoulder flexion, shoulder horizontal abduction, shoulder extension.  X10 left UE UBE level 2.0 x3 min each direction with PT present to discuss status Standing rows with green tband 2x10 Standing shoulder extension with green tband 2x10 Standing lower trap wall lift offs 2x10 Prone on elbows performing cervical diagonal motion x10 bilat (with reports of tingling down left arm) Seated median nerve stretch x10 left UE (relieved tingling)   06/02/2024: Discussion of POC  UBE level 1.0 x6 min  with PT present to discuss status - pt did not tolerate fwd very well today. Reverse was better. MMT Prone T and Y 1# wt x 20 B Prone Y to W to Y x 5 Seated ulnar nerve glide x 10 (improves with reps) Seated median nerve stretch x 10 L Standing rows with green tband 2x10 Standing horizontal ABD green  x 20 Standing shoulder extension green band x 20    05/24/2024: UBE level 1.0 x5 min  with PT present to discuss status - pt did not tolerate fwd very well today. Reverse was better. Standing median nerve stretch x 10 L DN education and aftercare Cervical ROM post DN  Trigger Point Dry Needling  Initial Treatment: Pt instructed on Dry Needling rational, procedures, and possible side effects. Pt instructed to expect mild to moderate muscle soreness later in the day and/or into the next day.  Pt  instructed in methods to reduce muscle soreness. Pt instructed to continue prescribed HEP. Because Dry Needling was performed over or adjacent to a lung field, pt was educated on S/S of pneumothorax and to seek immediate medical attention should they occur.  Patient was educated on signs and symptoms of infection and other risk factors and advised to seek medical attention should they occur.  Patient verbalized understanding of these instructions and education.   Patient Verbal Consent Given: Yes Education Handout Provided: Yes Muscles Treated: L pecs, UT, cervical multifidi, biceps , wrist extensors, deltoids Electrical Stimulation Performed: No Treatment Response/Outcome: Utilized skilled palpation to identify bony landmarks and trigger points.  Able to illicit twitch response and muscle elongation.  Soft tissue mobilization to muscles needled to further promote tissue elongation and decreased pain.    PA mobs to lower cervical and upper thoracic spine    PATIENT EDUCATION:  Education details: Issued HEP Person educated: Patient Education method: Explanation, Demonstration, and Handouts Education comprehension: verbalized understanding and returned demonstration  HOME EXERCISE PROGRAM: Access Code: [email or web address] URL: [email or web address] Date: 06/02/2024 Prepared by: Mliss  Exercises - Seated Upper Trapezius Stretch  - 1 x daily - 7 x weekly - 2 reps - 20 sec hold - Seated Levator Scapulae Stretch  - 1 x daily - 7 x weekly - 2 reps - 20 sec hold - Seated Cervical Retraction  - 1 x daily - 7 x  weekly - 2 sets - 10 reps - Mid-Lower Cervical Extension SNAG with Strap  - 1 x daily - 7 x weekly - 2 sets - 10 reps - Shoulder External Rotation and Scapular Retraction with Resistance  - 1 x daily - 7 x weekly - 2 sets - 10 reps - Standing Shoulder Horizontal Abduction with Resistance  - 1 x daily - 7 x weekly - 2 sets - 10 reps - Shoulder Internal Rotation with Resistance  - 1 x  daily - 3 x weekly - 2 sets - 10 reps - Shoulder External Rotation with Anchored Resistance  - 1 x daily - 3 x weekly - 2 sets - 10 reps - Standing Shoulder Flexion with Resistance  - 1 x daily - 3 x weekly - 2 sets - 10 reps - Scaption with Resistance  - 1 x daily - 3 x weekly - 2 sets - 10 reps - Standing Single Arm Shoulder Abduction with Resistance  - 1 x daily - 3 x weekly - 2 sets - 10 reps - Standing Median Nerve Glide  - 1 x daily - 7 x weekly - 1 sets - 10 reps - Standing Ulnar Nerve Glide  - 1 x daily - 7 x weekly - 1 sets - 10 reps - Prone Scapular Retraction Y  - 1 x daily - 3 x weekly - 2 sets - 10 reps  ASSESSMENT:  CLINICAL IMPRESSION: Ms Ueda presents to skilled PT reporting that she is not currently having any pain, but has not tried pickle ball again.  Patient with greatly improved Neck Disability Index and improved left grip strength to meet both of those goals.  Patient with increased tingling noted with prone on elbows exercise, but improved after exercise and when performed median nerve glides.  Patient continues to require skilled PT to progress towards goal related activities.  OBJECTIVE IMPAIRMENTS: decreased strength, increased fascial restrictions, increased muscle spasms, impaired sensation, postural dysfunction, and pain.   ACTIVITY LIMITATIONS: carrying, lifting, and sleeping  PARTICIPATION LIMITATIONS: cleaning, driving, and community activity  PERSONAL FACTORS: Past/current experiences, Time since onset of injury/illness/exacerbation, and 1 comorbidity: HTN are also affecting patient's functional outcome.   REHAB POTENTIAL: Good  CLINICAL DECISION MAKING: Stable/uncomplicated  EVALUATION COMPLEXITY: Low   GOALS: Goals reviewed with patient? Yes  SHORT TERM GOALS: Target date: 05/28/2024  Patient will be independent with initial HEP. Baseline:  Goal status: Met on 05/19/24  2.  Patient will report at least a 25% improvement in symptoms since  starting PT. Baseline:  Goal status: MET 06/02/24    LONG TERM GOALS: Target date: 06/25/2024  Patient will be independent with advanced HEP to allow for self progression after discharge. Baseline:  Goal status: Ongoing  2.  Patient will report ability to play pickle-ball without increased pain. Baseline:  Goal status: Ongoing  3.  Patient will increase left grip strength to at least 50# to allow her to open tight jars. Baseline: 45# Goal status: Met on 06/07/2024  4.  Patient will improve Neck Disability Index to no greater than 20% to demonstrate improvements in functional tasks. Baseline: 38% Goal status: Met on 06/07/24  5.  Patient will increase left shoulder strength to at least 4+ to 5-/5 to allow her to perform more advanced tasks at home, such as cleaning and performing yard work. Baseline:  Goal status: MET   PLAN:  PT FREQUENCY: 1-2x/week  PT DURATION: 8 weeks  PLANNED INTERVENTIONS: 02835- PT Re-evaluation, 97750- Physical  Performance Testing, 97110-Therapeutic exercises, 97530- Therapeutic activity, [id number]- Neuromuscular re-education, [id number]- Self Care, 02859- Manual therapy, [phone]- Canalith repositioning, [id number]- Aquatic Therapy, [phone]- Electrical stimulation (unattended), [phone]- Electrical stimulation (manual), [id number]- Vasopneumatic device, [id number]- Ultrasound, [id number]- Traction (mechanical), [id number]- Ionotophoresis 4mg /ml Dexamethasone, 20560 (1-2 muscles), 20561 (3+ muscles)- Dry Needling, Patient/Family education, Balance training, Taping, Joint mobilization, Joint manipulation, Spinal manipulation, Spinal mobilization, Vestibular training, Cryotherapy, and Moist heat  PLAN FOR NEXT SESSION:  DN as indicated, low trap/mid trap strengthening, assess ulnar and median nerve glides.   Jarrell Laming, PT, DPT 06/07/24, 12:47 PM   [facility] [address] [place], [place] [address] Phone # [phone] Fax [phone]

## 2024-06-09 ENCOUNTER — Encounter: Admitting: Rehabilitative and Restorative Service Providers"

## 2024-06-14 ENCOUNTER — Encounter: Payer: Self-pay | Admitting: Rehabilitative and Restorative Service Providers"

## 2024-06-14 ENCOUNTER — Ambulatory Visit: Admitting: Rehabilitative and Restorative Service Providers"

## 2024-06-14 DIAGNOSIS — R252 Cramp and spasm: Secondary | ICD-10-CM | POA: Diagnosis not present

## 2024-06-14 DIAGNOSIS — R293 Abnormal posture: Secondary | ICD-10-CM

## 2024-06-14 DIAGNOSIS — M6281 Muscle weakness (generalized): Secondary | ICD-10-CM

## 2024-06-14 DIAGNOSIS — M5412 Radiculopathy, cervical region: Secondary | ICD-10-CM | POA: Diagnosis not present

## 2024-06-14 NOTE — Therapy (Signed)
 OUTPATIENT PHYSICAL THERAPY TREATMENT NOTE   Patient Name: Rachael Green MRN: [id number] DOB:Feb 20, 1966, 58 y.o., female Today's Date: 06/14/2024  END OF SESSION:  PT End of Session - 06/14/24 0852     Visit Number 8    Date for PT Re-Evaluation 06/25/24    Authorization Type [place] [employer]    PT Start Time [phone]    PT Stop Time [phone]    PT Time Calculation (min) 39 min    Activity Tolerance Patient tolerated treatment well    Behavior During Therapy [facility] for tasks assessed/performed              Past Medical History:  Diagnosis Date   Allergy    Anxiety    Depression    Diverticulosis    GERD (gastroesophageal reflux disease)    Hypertension    Thyroid  disease    Past Surgical History:  Procedure Laterality Date   ARTHROSCOPIC REPAIR ACL     left   WISDOM TOOTH EXTRACTION     Patient Active Problem List   Diagnosis Date Noted   Left cervical radiculopathy 03/29/2024   Left knee pain 11/29/2021   Glaucoma 02/07/2021   Hyperglycemia 03/11/2019   Essential hypertension 11/11/2018   Hypothyroidism 11/11/2018   Depression, major, in remission (HCC) 11/11/2018   Anxiety 11/11/2018   GERD (gastroesophageal reflux disease) 11/11/2018   Dyslipidemia 11/11/2018   Allergic rhinitis 11/11/2018   Hearing loss 11/11/2018   OSA (obstructive sleep apnea) 11/11/2018   Steatosis of liver 01/09/2018   Lipoma of upper arm 05/10/2016   Insomnia 03/17/2012    PCP: Kennyth Worth HERO, MD  REFERRING PROVIDER: Claudene Arthea HERO, DO  REFERRING DIAG: [phone] (ICD-10-CM) - Left cervical radiculopathy  THERAPY DIAG:  Muscle weakness (generalized)  Cramp and spasm  Radiculopathy, cervical region  Abnormal posture  Rationale for Evaluation and Treatment: Rehabilitation  ONSET DATE: severely for June 2025  SUBJECTIVE:                                                                                                                                                                                                          SUBJECTIVE STATEMENT: Pt denies any current pain in her neck/shoulder.  She does report that she fell getting out of the bathtub, so she is having increased pain in her left foot with patient reporting bruising around her left foot.  Hand dominance: Left  PERTINENT HISTORY:  HTN.  Cervical radiculopathy with epidural injection in July 2025.  PAIN:  Are you having pain? Yes: NPRS scale: 8/10  Pain location: left foot Pain description: it hurts Aggravating factors: unknown Relieving factors: sometimes a position change  PRECAUTIONS: None  RED FLAGS: None     WEIGHT BEARING RESTRICTIONS: No  FALLS:  Has patient fallen in last 6 months? No  LIVING ENVIRONMENT: Lives with: lives with their spouse Lives in: House/apartment Stairs: split level home Has following equipment at home: None  OCCUPATION: Retired  PLOF: Independent and Leisure: Pickle-ball  PATIENT GOALS: To return to playing pickle-ball and be able to work out at [employer].  NEXT MD VISIT: Dr Kennyth on 06/24/24  OBJECTIVE:  Note: Objective measures were completed at Evaluation unless otherwise noted.  DIAGNOSTIC FINDINGS:  Cervical MRI on 03/31/2024: IMPRESSION: 1. Cervical spondylosis as outlined within the body of the report. 2. At C5-C6, there is moderate disc degeneration. Disc bulge with endplate spurring and left greater than right uncovertebral hypertrophy. Superimposed small central disc protrusion. Mild spinal canal stenosis. The disc protrusion mildly flattens the ventral aspect of the spinal cord. Bilateral neural foraminal narrowing (moderate right, severe left). 3. At C6-C7, there is moderate disc degeneration. Shallow disc bulge. Superimposed broad-based left center/foraminal disc protrusion. No significant spinal canal stenosis. The disc protrusion contributes to severe left neural foraminal narrowing. 4. No more than mild spinal canal narrowing,  and no significant foraminal narrowing, at the remaining cervical levels. 5. Mild multilevel facet arthropathy.  Cervical Radiograph on 03/29/24: IMPRESSION: No acute displaced fracture or traumatic listhesis of the cervical spine.  PATIENT SURVEYS:  NDI:  NECK DISABILITY INDEX   Score Date: 05/03/2024 06/07/24  Pain intensity 2 = The pain is moderate at the moment 0  2. Personal care (washing, dressing, etc.) 1 =  I can look after myself normally but it causes extra pain 0  3. Lifting 2 = Pain prevents me lifting heavy weights off the floor, but I can manage if they are  conveniently placed, for example on a table 0  4. Reading 3 = I can't read as much as I want because of moderate pain in my neck 0  5. Headaches 0 = I have no headaches at all 1  6. Concentration 1 =  I can concentrate fully when I want to with slight difficulty  0  7. Work 2 = I can do most of my usual work, but no more 0  8. Driving 3 = I can't drive my car as long as I want because of moderate pain in my neck 0  9. Sleeping 2 = My sleep is mildly disturbed (1-2 hrs sleepless) 0  10. Recreation 3 = I am able to engage in a few of my usual recreation activities because of pain in   my neck 1  Total 19/50 = 38% 2/50 = 4.0%   Minimum Detectable Change (90% confidence): 5 points or 10% points  COGNITION: Overall cognitive status: Within functional limits for tasks assessed  SENSATION: Pt reports numbness and tingling down left arm  POSTURE: rounded shoulders and forward head  PALPATION: Patient with increased muscle tension/spasms noted along bilateral cervical paraspinals, upper traps, and rhomboids   CERVICAL ROM:   Active ROM A/ROM (deg) eval  Flexion 44  Extension 40  Right lateral flexion 40  Left lateral flexion 35  Right rotation 45  Left rotation 45   (Blank rows = not tested)  UPPER EXTREMITY ROM:  [facility]  UPPER EXTREMITY MMT:  MMT Right eval Left eval Right 06/02/24 Left  06/02/24  Shoulder  flexion 5 5-  5  Shoulder extension      Shoulder abduction 5 4+  5  Shoulder adduction      Shoulder internal rotation 4+ 4-  5  Shoulder external rotation 4+ 4-  5  Middle trapezius   5 4-  Lower trapezius   4 4-  Grip strength 49 45     (Blank rows = not tested)  06/07/2024: Right: 50lbs Left:  50 lbs   CERVICAL SPECIAL TESTS:  Distraction test: patient reports less pain  FUNCTIONAL TESTS:  Eval:  5 times sit to stand: 6.28 sec  TREATMENT DATE:  06/14/2024: Nustep level 6 x5 min with PT present to discuss status Seated cervical retraction 2x10 Seated ulnar nerve glide x 10 left UE Seated median nerve stretch x 10 left UE Prone with 1# weight:  shoulder flexion, shoulder horizontal abduction, shoulder extension.  2X10 left UE Supine cervical MELT method performing cervical rotation and flexion/extension on soft foam roll x10 bilat Supine on soft foam roll (vertical) performing snow angels x15 Supine on soft foam roll (vertical) performing shoulder horizontal abduction with green tband 2x10 Standing rows with green tband 2x10 Standing shoulder extension with green tband 2x10 Standing lower trap wall lift offs 2x10 Standing simulated racket swings with 2# dumbbell with left UE x1 min, then no weight x1 min   06/07/2024: Seated sternocleidomastoid stretch 2x20 sec bilat Seated cervical retraction 2x10 Seated ulnar nerve glide x 10 Seated median nerve stretch x 10 Prone with 1# weight:  shoulder flexion, shoulder horizontal abduction, shoulder extension.  X10 left UE UBE level 2.0 x3 min each direction with PT present to discuss status Standing rows with green tband 2x10 Standing shoulder extension with green tband 2x10 Standing lower trap wall lift offs 2x10 Prone on elbows performing cervical diagonal motion x10 bilat (with reports of tingling down left arm) Seated median nerve stretch x10 left UE (relieved tingling)   06/02/2024: Discussion of POC  UBE level 1.0 x6 min   with PT present to discuss status - pt did not tolerate fwd very well today. Reverse was better. MMT Prone T and Y 1# wt x 20 B Prone Y to W to Y x 5 Seated ulnar nerve glide x 10 (improves with reps) Seated median nerve stretch x 10 L Standing rows with green tband 2x10 Standing horizontal ABD green  x 20 Standing shoulder extension green band x 20      PATIENT EDUCATION:  Education details: Issued HEP Person educated: Patient Education method: Explanation, Demonstration, and Handouts Education comprehension: verbalized understanding and returned demonstration  HOME EXERCISE PROGRAM: Access Code: [email or web address] URL: [email or web address] Date: 06/02/2024 Prepared by: Mliss  Exercises - Seated Upper Trapezius Stretch  - 1 x daily - 7 x weekly - 2 reps - 20 sec hold - Seated Levator Scapulae Stretch  - 1 x daily - 7 x weekly - 2 reps - 20 sec hold - Seated Cervical Retraction  - 1 x daily - 7 x weekly - 2 sets - 10 reps - Mid-Lower Cervical Extension SNAG with Strap  - 1 x daily - 7 x weekly - 2 sets - 10 reps - Shoulder External Rotation and Scapular Retraction with Resistance  - 1 x daily - 7 x weekly - 2 sets - 10 reps - Standing Shoulder Horizontal Abduction with Resistance  - 1 x daily - 7 x weekly - 2 sets - 10 reps - Shoulder Internal Rotation with Resistance  - 1 x daily - 3 x weekly -  2 sets - 10 reps - Shoulder External Rotation with Anchored Resistance  - 1 x daily - 3 x weekly - 2 sets - 10 reps - Standing Shoulder Flexion with Resistance  - 1 x daily - 3 x weekly - 2 sets - 10 reps - Scaption with Resistance  - 1 x daily - 3 x weekly - 2 sets - 10 reps - Standing Single Arm Shoulder Abduction with Resistance  - 1 x daily - 3 x weekly - 2 sets - 10 reps - Standing Median Nerve Glide  - 1 x daily - 7 x weekly - 1 sets - 10 reps - Standing Ulnar Nerve Glide  - 1 x daily - 7 x weekly - 1 sets - 10 reps - Prone Scapular Retraction Y  - 1 x daily - 3 x weekly -  2 sets - 10 reps  ASSESSMENT:  CLINICAL IMPRESSION: Ms Spees presents to skilled PT reporting that her shoulder/cervical region are still feeling better.  She states that she was not able to return to playing pickle ball yet, as she fell in the bathroom and injured her left foot and has significant bruising. Patient able to progress with upper body strengthening during session.  Performed Nustep today instead of UBE to allow for some mobility of her left foot during session.  Patient reported some pain with use of soft foam roll as the roll provided the desired gentle manual trigger point release to the tight musculature in her cervical and scapular region.  Able to start some simulated racket swinging on left UE, with patient reporting my arm feels weak.  Patient continues to require skilled PT to progress towards goal related activities.  OBJECTIVE IMPAIRMENTS: decreased strength, increased fascial restrictions, increased muscle spasms, impaired sensation, postural dysfunction, and pain.   ACTIVITY LIMITATIONS: carrying, lifting, and sleeping  PARTICIPATION LIMITATIONS: cleaning, driving, and community activity  PERSONAL FACTORS: Past/current experiences, Time since onset of injury/illness/exacerbation, and 1 comorbidity: HTN are also affecting patient's functional outcome.   REHAB POTENTIAL: Good  CLINICAL DECISION MAKING: Stable/uncomplicated  EVALUATION COMPLEXITY: Low   GOALS: Goals reviewed with patient? Yes  SHORT TERM GOALS: Target date: 05/28/2024  Patient will be independent with initial HEP. Baseline:  Goal status: Met on 05/19/24  2.  Patient will report at least a 25% improvement in symptoms since starting PT. Baseline:  Goal status: MET 06/02/24    LONG TERM GOALS: Target date: 06/25/2024  Patient will be independent with advanced HEP to allow for self progression after discharge. Baseline:  Goal status: Ongoing  2.  Patient will report ability to play  pickle-ball without increased pain. Baseline:  Goal status: Ongoing  3.  Patient will increase left grip strength to at least 50# to allow her to open tight jars. Baseline: 45# Goal status: Met on 06/07/2024  4.  Patient will improve Neck Disability Index to no greater than 20% to demonstrate improvements in functional tasks. Baseline: 38% Goal status: Met on 06/07/24  5.  Patient will increase left shoulder strength to at least 4+ to 5-/5 to allow her to perform more advanced tasks at home, such as cleaning and performing yard work. Baseline:  Goal status: MET   PLAN:  PT FREQUENCY: 1-2x/week  PT DURATION: 8 weeks  PLANNED INTERVENTIONS: 97164- PT Re-evaluation, 97750- Physical Performance Testing, 97110-Therapeutic exercises, 97530- Therapeutic activity, [id number]- Neuromuscular re-education, 97535- Self Care, 02859- Manual therapy, [id number]- Canalith repositioning, [id number]- Aquatic Therapy, H9716- Electrical stimulation (unattended), [id number]- Electrical stimulation (manual),  02983- Vasopneumatic device, [id number]- Ultrasound, 02987- Traction (mechanical), [id number]- Ionotophoresis 4mg /ml Dexamethasone, 79439 (1-2 muscles), 20561 (3+ muscles)- Dry Needling, Patient/Family education, Balance training, Taping, Joint mobilization, Joint manipulation, Spinal manipulation, Spinal mobilization, Vestibular training, Cryotherapy, and Moist heat  PLAN FOR NEXT SESSION:  DN as indicated, low trap/mid trap strengthening, assess ulnar and median nerve glides.   Jarrell Laming, PT, DPT 06/14/24, 9:36 AM   [facility] [address] [place], [place] [address] Phone # [phone] Fax [phone]

## 2024-06-16 ENCOUNTER — Ambulatory Visit: Admitting: Rehabilitative and Restorative Service Providers"

## 2024-06-21 ENCOUNTER — Ambulatory Visit: Admitting: Rehabilitative and Restorative Service Providers"

## 2024-06-23 ENCOUNTER — Ambulatory Visit: Admitting: Physical Therapy

## 2024-06-23 ENCOUNTER — Encounter: Payer: Self-pay | Admitting: Physical Therapy

## 2024-06-23 DIAGNOSIS — R293 Abnormal posture: Secondary | ICD-10-CM | POA: Diagnosis not present

## 2024-06-23 DIAGNOSIS — M6281 Muscle weakness (generalized): Secondary | ICD-10-CM

## 2024-06-23 DIAGNOSIS — M5412 Radiculopathy, cervical region: Secondary | ICD-10-CM

## 2024-06-23 DIAGNOSIS — R252 Cramp and spasm: Secondary | ICD-10-CM

## 2024-06-23 NOTE — Therapy (Signed)
 OUTPATIENT PHYSICAL THERAPY TREATMENT NOTE AND RE-CERTIFICATION   Patient Name: Rachael Green MRN: [id number] DOB:1966-05-07, 58 y.o., female Today's Date: 06/23/2024  END OF SESSION:        Past Medical History:  Diagnosis Date   Allergy    Anxiety    Depression    Diverticulosis    GERD (gastroesophageal reflux disease)    Hypertension    Thyroid  disease    Past Surgical History:  Procedure Laterality Date   ARTHROSCOPIC REPAIR ACL     left   WISDOM TOOTH EXTRACTION     Patient Active Problem List   Diagnosis Date Noted   Left cervical radiculopathy 03/29/2024   Left knee pain 11/29/2021   Glaucoma 02/07/2021   Hyperglycemia 03/11/2019   Essential hypertension 11/11/2018   Hypothyroidism 11/11/2018   Depression, major, in remission 11/11/2018   Anxiety 11/11/2018   GERD (gastroesophageal reflux disease) 11/11/2018   Dyslipidemia 11/11/2018   Allergic rhinitis 11/11/2018   Hearing loss 11/11/2018   OSA (obstructive sleep apnea) 11/11/2018   Steatosis of liver 01/09/2018   Lipoma of upper arm 05/10/2016   Insomnia 03/17/2012    PCP: Kennyth Worth HERO, MD  REFERRING PROVIDER: Claudene Arthea HERO, DO  REFERRING DIAG: [phone] (ICD-10-CM) - Left cervical radiculopathy  THERAPY DIAG:  No diagnosis found.  Rationale for Evaluation and Treatment: Rehabilitation  ONSET DATE: severely for June 2025  SUBJECTIVE:                                                                                                                                                                                                         SUBJECTIVE STATEMENT: Played pickleball on Monday. Forearm immediately started hurting and never really resolved. Patient played for 3 hours. She notices that her arm gets tired with cooking, grating cheese eg, lifting pans.  Hand dominance: Left  PERTINENT HISTORY:  HTN.  Cervical radiculopathy with epidural injection in July 2025.  PAIN:  Are you  having pain? Yes: NPRS scale: 8/10 Pain location: left foot Pain description: it hurts Aggravating factors: unknown Relieving factors: sometimes a position change  PRECAUTIONS: None  RED FLAGS: None     WEIGHT BEARING RESTRICTIONS: No  FALLS:  Has patient fallen in last 6 months? No  LIVING ENVIRONMENT: Lives with: lives with their spouse Lives in: House/apartment Stairs: split level home Has following equipment at home: None  OCCUPATION: Retired  PLOF: Independent and Leisure: Pickle-ball  PATIENT GOALS: To return to playing pickle-ball and be able to work out at [employer].  NEXT MD VISIT: Dr Kennyth on  06/24/24  OBJECTIVE:  Note: Objective measures were completed at Evaluation unless otherwise noted.  DIAGNOSTIC FINDINGS:  Cervical MRI on 03/31/2024: IMPRESSION: 1. Cervical spondylosis as outlined within the body of the report. 2. At C5-C6, there is moderate disc degeneration. Disc bulge with endplate spurring and left greater than right uncovertebral hypertrophy. Superimposed small central disc protrusion. Mild spinal canal stenosis. The disc protrusion mildly flattens the ventral aspect of the spinal cord. Bilateral neural foraminal narrowing (moderate right, severe left). 3. At C6-C7, there is moderate disc degeneration. Shallow disc bulge. Superimposed broad-based left center/foraminal disc protrusion. No significant spinal canal stenosis. The disc protrusion contributes to severe left neural foraminal narrowing. 4. No more than mild spinal canal narrowing, and no significant foraminal narrowing, at the remaining cervical levels. 5. Mild multilevel facet arthropathy.  Cervical Radiograph on 03/29/24: IMPRESSION: No acute displaced fracture or traumatic listhesis of the cervical spine.  PATIENT SURVEYS:  NDI:  NECK DISABILITY INDEX   Score Date: 05/03/2024 06/07/24  Pain intensity 2 = The pain is moderate at the moment 0  2. Personal care (washing, dressing, etc.) 1  =  I can look after myself normally but it causes extra pain 0  3. Lifting 2 = Pain prevents me lifting heavy weights off the floor, but I can manage if they are  conveniently placed, for example on a table 0  4. Reading 3 = I can't read as much as I want because of moderate pain in my neck 0  5. Headaches 0 = I have no headaches at all 1  6. Concentration 1 =  I can concentrate fully when I want to with slight difficulty  0  7. Work 2 = I can do most of my usual work, but no more 0  8. Driving 3 = I can't drive my car as long as I want because of moderate pain in my neck 0  9. Sleeping 2 = My sleep is mildly disturbed (1-2 hrs sleepless) 0  10. Recreation 3 = I am able to engage in a few of my usual recreation activities because of pain in   my neck 1  Total 19/50 = 38% 2/50 = 4.0%   Minimum Detectable Change (90% confidence): 5 points or 10% points  COGNITION: Overall cognitive status: Within functional limits for tasks assessed  SENSATION: Pt reports numbness and tingling down left arm  POSTURE: rounded shoulders and forward head  PALPATION: Patient with increased muscle tension/spasms noted along bilateral cervical paraspinals, upper traps, and rhomboids   CERVICAL ROM:   Active ROM A/ROM (deg) eval  Flexion 44  Extension 40  Right lateral flexion 40  Left lateral flexion 35  Right rotation 45  Left rotation 45   (Blank rows = not tested)  UPPER EXTREMITY ROM:  [facility]  UPPER EXTREMITY MMT:  MMT Right eval Left eval Right 06/02/24 Left  06/02/24 L 06/23/24  Shoulder flexion 5 5-  5   Shoulder extension       Shoulder abduction 5 4+  5   Shoulder adduction       Shoulder internal rotation 4+ 4-  5   Shoulder external rotation 4+ 4-  5   Middle trapezius   5 4-   Lower trapezius   4 4-   Grip strength 49 45   47 avg 50/50/40   (Blank rows = not tested)  06/07/2024: Right: 50lbs Left:  50 lbs  06/23/24 L wrist flex and ext 5/5 and pro/sup 5/5,  thumb flex  3+/5   CERVICAL SPECIAL TESTS:  Distraction test: patient reports less pain  FUNCTIONAL TESTS:  Eval:  5 times sit to stand: 6.28 sec  TREATMENT DATE:  06/23/2024: Elbow, wrist and forearm strength assessed Seated wrist ext and flexion 1# x 30 ea (2 and 3# too heavy) Seated forearm pro/sup 1# x 30 Seated wrist flexor stretch 2x30 sec ea (elbow straight) Seated flexor digitorum stretch (fingers 3 and 4)  Seated wrist extensor stretch 2x30 sec Standing biceps curls 5# x 10, hammer curls x 10 (harder and feels tightness in her forearm) Standing deltoid fly bent elbow 3# x 20 Triceps ext L 3# x 20 Ulnar nerve tension - negative today Standing median nerve glide - added R lateral neck flexion today x 10 left UE   06/14/2024: Nustep level 6 x5 min with PT present to discuss status Seated cervical retraction 2x10 Seated ulnar nerve glide x 10 left UE Seated median nerve stretch x 10 left UE Prone with 1# weight:  shoulder flexion, shoulder horizontal abduction, shoulder extension.  2X10 left UE Supine cervical MELT method performing cervical rotation and flexion/extension on soft foam roll x10 bilat Supine on soft foam roll (vertical) performing snow angels x15 Supine on soft foam roll (vertical) performing shoulder horizontal abduction with green tband 2x10 Standing rows with green tband 2x10 Standing shoulder extension with green tband 2x10 Standing lower trap wall lift offs 2x10 Standing simulated racket swings with 2# dumbbell with left UE x1 min, then no weight x1 min   06/07/2024: Seated sternocleidomastoid stretch 2x20 sec bilat Seated cervical retraction 2x10 Seated ulnar nerve glide x 10 Seated median nerve stretch x 10 Prone with 1# weight:  shoulder flexion, shoulder horizontal abduction, shoulder extension.  X10 left UE UBE level 2.0 x3 min each direction with PT present to discuss status Standing rows with green tband 2x10 Standing shoulder extension with green tband  2x10 Standing lower trap wall lift offs 2x10 Prone on elbows performing cervical diagonal motion x10 bilat (with reports of tingling down left arm) Seated median nerve stretch x10 left UE (relieved tingling)   06/02/2024: Discussion of POC  UBE level 1.0 x6 min  with PT present to discuss status - pt did not tolerate fwd very well today. Reverse was better. MMT Prone T and Y 1# wt x 20 B Prone Y to W to Y x 5 Seated ulnar nerve glide x 10 (improves with reps) Seated median nerve stretch x 10 L Standing rows with green tband 2x10 Standing horizontal ABD green  x 20 Standing shoulder extension green band x 20      PATIENT EDUCATION:  Education details: ongoing PT POC, HEP review, and HEP update   Person educated: Patient Education method: Explanation, Demonstration, and Handouts Education comprehension: verbalized understanding and returned demonstration  HOME EXERCISE PROGRAM: Access Code: [email or web address] URL: [email or web address] Date: 06/02/2024 Prepared by: Mliss  Exercises - Seated Upper Trapezius Stretch  - 1 x daily - 7 x weekly - 2 reps - 20 sec hold - Seated Levator Scapulae Stretch  - 1 x daily - 7 x weekly - 2 reps - 20 sec hold - Seated Cervical Retraction  - 1 x daily - 7 x weekly - 2 sets - 10 reps - Mid-Lower Cervical Extension SNAG with Strap  - 1 x daily - 7 x weekly - 2 sets - 10 reps - Shoulder External Rotation and Scapular Retraction with Resistance  - 1 x daily - 7 x weekly -  2 sets - 10 reps - Standing Shoulder Horizontal Abduction with Resistance  - 1 x daily - 7 x weekly - 2 sets - 10 reps - Shoulder Internal Rotation with Resistance  - 1 x daily - 3 x weekly - 2 sets - 10 reps - Shoulder External Rotation with Anchored Resistance  - 1 x daily - 3 x weekly - 2 sets - 10 reps - Standing Shoulder Flexion with Resistance  - 1 x daily - 3 x weekly - 2 sets - 10 reps - Scaption with Resistance  - 1 x daily - 3 x weekly - 2 sets - 10 reps -  Standing Single Arm Shoulder Abduction with Resistance  - 1 x daily - 3 x weekly - 2 sets - 10 reps - Standing Median Nerve Glide  - 1 x daily - 7 x weekly - 1 sets - 10 reps - Standing Ulnar Nerve Glide  - 1 x daily - 7 x weekly - 1 sets - 10 reps - Prone Scapular Retraction Y  - 1 x daily - 3 x weekly - 2 sets - 10 reps  ASSESSMENT:  CLINICAL IMPRESSION: Patient played pickleball on Monday and had immediate pain and tightness in the L forearm. Pain and tightness persists today. She demonstrates visible atrophy in the left UE compared to the R, but tests 5/5 with MMT. She fatigues easily with PRE in the elbow, wrist and forearm. She has pain in the L brachioradialis with hammer curls and has marked TTP as well. She reports her neck and shoulder still have no pain. She no longer feels tension in the ulnar nerve, but he median nerve still has tension. Progressed the glide with a head tilt today. She also has tightness in her L wrist flexors and extensors so stretches added to HEP. Advised pt she could reduce her frequency of shoulder strengthening to 1x/wk. Her deficits affect her ability to cook and perform ADLS normally. She states she is doing more with her R UE to compensate. Pt will benefit from continued skilled PT to address these deficits and those listed below. I recommend 1-2 x/wk for 8 weeks.   OBJECTIVE IMPAIRMENTS: decreased strength, increased fascial restrictions, increased muscle spasms, impaired sensation, postural dysfunction, and pain.   ACTIVITY LIMITATIONS: carrying, lifting, and sleeping  PARTICIPATION LIMITATIONS: cleaning, driving, and community activity  PERSONAL FACTORS: Past/current experiences, Time since onset of injury/illness/exacerbation, and 1 comorbidity: HTN are also affecting patient's functional outcome.   REHAB POTENTIAL: Good  CLINICAL DECISION MAKING: Stable/uncomplicated  EVALUATION COMPLEXITY: Low   GOALS: Goals reviewed with patient? Yes  SHORT  TERM GOALS: Target date: 05/28/2024  Patient will be independent with initial HEP. Baseline:  Goal status: Met on 05/19/24  2.  Patient will report at least a 25% improvement in symptoms since starting PT. Baseline:  Goal status: MET 06/02/24    LONG TERM GOALS: Target date: 08/23/24  Patient will be independent with advanced HEP to allow for self progression after discharge. Baseline:  Goal status: Ongoing  2.  Patient will report ability to play pickle-ball without increased pain. Baseline:  Goal status: Ongoing  3.  Patient will increase left grip strength to at least 50# to allow her to open tight jars. Baseline: 45# Goal status: Met on 06/07/2024  4.  Patient will improve Neck Disability Index to no greater than 20% to demonstrate improvements in functional tasks. Baseline: 38% Goal status: Met on 06/07/24  5.  Patient will increase left shoulder  strength to at least 4+ to 5-/5 to allow her to perform more advanced tasks at home, such as cleaning and performing yard work. Baseline:  Goal status: MET  6.  Patient able to cook and perform ADLS with normal use and strength of L UE Baseline:  Goal status: NEW  7.  Patient able to use her left UE for ADLS without pain provocation. Baseline:  Goal status: NEW     PLAN:  PT FREQUENCY: 1-2x/week  PT DURATION: 8 weeks  PLANNED INTERVENTIONS: 97164- PT Re-evaluation, 97750- Physical Performance Testing, 97110-Therapeutic exercises, 97530- Therapeutic activity, 97112- Neuromuscular re-education, 97535- Self Care, [id number]- Manual therapy, [phone]- Canalith repositioning, [id number]- Aquatic Therapy, [phone]- Electrical stimulation (unattended), [phone]- Electrical stimulation (manual), [id number]- Vasopneumatic device, [id number]- Ultrasound, [id number]- Traction (mechanical), [id number]- Ionotophoresis 4mg /ml Dexamethasone, 79439 (1-2 muscles), [id number] (3+ muscles)- Dry Needling, Patient/Family education, Balance training, Taping, Joint mobilization, Joint  manipulation, Spinal manipulation, Spinal mobilization, Vestibular training, Cryotherapy, and Moist heat  PLAN FOR NEXT SESSION:  Focus on elbow, wrist and forearm strength, median nerve glide, wrist flexibility, DN as indicated, low trap/mid trap strengthening   Mliss Cummins, PT  06/23/24, 9:21 AM   [facility] [address] [place], [place] [address] Phone # [phone] Fax [phone]

## 2024-06-24 ENCOUNTER — Encounter: Payer: 59 | Admitting: Family Medicine

## 2024-07-05 NOTE — Therapy (Signed)
 OUTPATIENT PHYSICAL THERAPY TREATMENT NOTE    Patient Name: Rachael Green MRN: [id number] DOB:August 11, 1966, 58 y.o., female Today's Date: 07/06/2024  END OF SESSION:  PT End of Session - 07/06/24 0937     Visit Number 10    Date for Recertification  08/23/24    Authorization Type [place] [employer]    PT Start Time [phone]    PT Stop Time 1013    PT Time Calculation (min) 40 min    Activity Tolerance Patient tolerated treatment well    Behavior During Therapy [facility] for tasks assessed/performed               Past Medical History:  Diagnosis Date   Allergy    Anxiety    Depression    Diverticulosis    GERD (gastroesophageal reflux disease)    Hypertension    Thyroid  disease    Past Surgical History:  Procedure Laterality Date   ARTHROSCOPIC REPAIR ACL     left   WISDOM TOOTH EXTRACTION     Patient Active Problem List   Diagnosis Date Noted   Left cervical radiculopathy 03/29/2024   Left knee pain 11/29/2021   Glaucoma 02/07/2021   Hyperglycemia 03/11/2019   Essential hypertension 11/11/2018   Hypothyroidism 11/11/2018   Depression, major, in remission 11/11/2018   Anxiety 11/11/2018   GERD (gastroesophageal reflux disease) 11/11/2018   Dyslipidemia 11/11/2018   Allergic rhinitis 11/11/2018   Hearing loss 11/11/2018   OSA (obstructive sleep apnea) 11/11/2018   Steatosis of liver 01/09/2018   Lipoma of upper arm 05/10/2016   Insomnia 03/17/2012    PCP: Kennyth Worth HERO, MD  REFERRING PROVIDER: Claudene Arthea HERO, DO  REFERRING DIAG: [phone] (ICD-10-CM) - Left cervical radiculopathy  THERAPY DIAG:  Muscle weakness (generalized)  Cramp and spasm  Radiculopathy, cervical region  Abnormal posture  Rationale for Evaluation and Treatment: Rehabilitation  ONSET DATE: severely for June 2025  SUBJECTIVE:                                                                                                                                                                                                          SUBJECTIVE STATEMENT: Driving numbness is gone. I can sleep on left side and doesn't go numb, Feel it sometimes with reading because my arm is on the armrest. I have not played pickleball. Playing today. I haven't been doing my exercises.   Hand dominance: Left  PERTINENT HISTORY:  HTN.  Cervical radiculopathy with epidural injection in July 2025.  PAIN:  Are you having pain? Yes:  NPRS scale: 0/10 Pain location: left foot Pain description: it hurts Aggravating factors: unknown Relieving factors: sometimes a position change  PRECAUTIONS: None  RED FLAGS: None     WEIGHT BEARING RESTRICTIONS: No  FALLS:  Has patient fallen in last 6 months? No  LIVING ENVIRONMENT: Lives with: lives with their spouse Lives in: House/apartment Stairs: split level home Has following equipment at home: None  OCCUPATION: Retired  PLOF: Independent and Leisure: Pickle-ball  PATIENT GOALS: To return to playing pickle-ball and be able to work out at [employer].  NEXT MD VISIT: Dr Kennyth on 06/24/24  OBJECTIVE:  Note: Objective measures were completed at Evaluation unless otherwise noted.  DIAGNOSTIC FINDINGS:  Cervical MRI on 03/31/2024: IMPRESSION: 1. Cervical spondylosis as outlined within the body of the report. 2. At C5-C6, there is moderate disc degeneration. Disc bulge with endplate spurring and left greater than right uncovertebral hypertrophy. Superimposed small central disc protrusion. Mild spinal canal stenosis. The disc protrusion mildly flattens the ventral aspect of the spinal cord. Bilateral neural foraminal narrowing (moderate right, severe left). 3. At C6-C7, there is moderate disc degeneration. Shallow disc bulge. Superimposed broad-based left center/foraminal disc protrusion. No significant spinal canal stenosis. The disc protrusion contributes to severe left neural foraminal narrowing. 4. No more than mild spinal canal  narrowing, and no significant foraminal narrowing, at the remaining cervical levels. 5. Mild multilevel facet arthropathy.  Cervical Radiograph on 03/29/24: IMPRESSION: No acute displaced fracture or traumatic listhesis of the cervical spine.  PATIENT SURVEYS:  NDI:  NECK DISABILITY INDEX   Score Date: 05/03/2024 06/07/24  Pain intensity 2 = The pain is moderate at the moment 0  2. Personal care (washing, dressing, etc.) 1 =  I can look after myself normally but it causes extra pain 0  3. Lifting 2 = Pain prevents me lifting heavy weights off the floor, but I can manage if they are  conveniently placed, for example on a table 0  4. Reading 3 = I can't read as much as I want because of moderate pain in my neck 0  5. Headaches 0 = I have no headaches at all 1  6. Concentration 1 =  I can concentrate fully when I want to with slight difficulty  0  7. Work 2 = I can do most of my usual work, but no more 0  8. Driving 3 = I can't drive my car as long as I want because of moderate pain in my neck 0  9. Sleeping 2 = My sleep is mildly disturbed (1-2 hrs sleepless) 0  10. Recreation 3 = I am able to engage in a few of my usual recreation activities because of pain in   my neck 1  Total 19/50 = 38% 2/50 = 4.0%   Minimum Detectable Change (90% confidence): 5 points or 10% points  COGNITION: Overall cognitive status: Within functional limits for tasks assessed  SENSATION: Pt reports numbness and tingling down left arm  POSTURE: rounded shoulders and forward head  PALPATION: Patient with increased muscle tension/spasms noted along bilateral cervical paraspinals, upper traps, and rhomboids   CERVICAL ROM:   Active ROM A/ROM (deg) eval  Flexion 44  Extension 40  Right lateral flexion 40  Left lateral flexion 35  Right rotation 45  Left rotation 45   (Blank rows = not tested)  UPPER EXTREMITY ROM:  [facility]  UPPER EXTREMITY MMT:  MMT Right eval Left eval Right 06/02/24 Left  06/02/24  L 06/23/24  Shoulder flexion 5 5-  5   Shoulder extension       Shoulder abduction 5 4+  5   Shoulder adduction       Shoulder internal rotation 4+ 4-  5   Shoulder external rotation 4+ 4-  5   Middle trapezius   5 4-   Lower trapezius   4 4-   Grip strength 49 45   47 avg 50/50/40   (Blank rows = not tested)  06/07/2024: Right: 50lbs Left:  50 lbs  06/23/24 L wrist flex and ext 5/5 and pro/sup 5/5, thumb flex 3+/5   CERVICAL SPECIAL TESTS:  Distraction test: patient reports less pain  FUNCTIONAL TESTS:  Eval:  5 times sit to stand: 6.28 sec  TREATMENT DATE:   10/7//2025: UBE L3 x 2 min fwd, L2.5 bwd x 2 min PT present to assess status Seated wrist ext and flexion 1# x 30 ea  Seated ulnar deviation x 10 1#, then 2# x 20 Seated radial deviation 2# x30 Seated forearm pro/sup 2# x 30 some popping in elbow Seated wrist flexor stretch 2x30 sec ea (elbow straight) Seated flexor digitorum stretch (fingers 3 and 4) reviewed Seated wrist extensor elbow straight stretch 2x30 sec Standing biceps curls 5# 3x 10, hammer curls 3 x 10  Standing deltoid fly bent elbow 3# x 20 challenging Triceps ext L 3# x 20 Standing upper cuts 3# 2x10 (simulating pickleball serve) Ulnar nerve tension - did B x 10  Standing median nerve glide - with R lateral neck flexion today x 10 left UE - some tingling reported  06/23/2024: Elbow, wrist and forearm strength assessed Seated wrist ext and flexion 1# x 30 ea (2 and 3# too heavy) Seated forearm pro/sup 1# x 30 Seated wrist flexor stretch 2x30 sec ea (elbow straight) Seated flexor digitorum stretch (fingers 3 and 4)  Seated wrist extensor stretch 2x30 sec Standing biceps curls 5# x 10, hammer curls x 10 (harder and feels tightness in her forearm) Standing deltoid fly bent elbow 3# x 20 Triceps ext L 3# x 20 Ulnar nerve tension - negative today Standing median nerve glide - added R lateral neck flexion today x 10 left UE   06/14/2024: Nustep  level 6 x5 min with PT present to discuss status Seated cervical retraction 2x10 Seated ulnar nerve glide x 10 left UE Seated median nerve stretch x 10 left UE Prone with 1# weight:  shoulder flexion, shoulder horizontal abduction, shoulder extension.  2X10 left UE Supine cervical MELT method performing cervical rotation and flexion/extension on soft foam roll x10 bilat Supine on soft foam roll (vertical) performing snow angels x15 Supine on soft foam roll (vertical) performing shoulder horizontal abduction with green tband 2x10 Standing rows with green tband 2x10 Standing shoulder extension with green tband 2x10 Standing lower trap wall lift offs 2x10 Standing simulated racket swings with 2# dumbbell with left UE x1 min, then no weight x1 min   06/07/2024: Seated sternocleidomastoid stretch 2x20 sec bilat Seated cervical retraction 2x10 Seated ulnar nerve glide x 10 Seated median nerve stretch x 10 Prone with 1# weight:  shoulder flexion, shoulder horizontal abduction, shoulder extension.  X10 left UE UBE level 2.0 x3 min each direction with PT present to discuss status Standing rows with green tband 2x10 Standing shoulder extension with green tband 2x10 Standing lower trap wall lift offs 2x10 Prone on elbows performing cervical diagonal motion x10 bilat (with reports of tingling down left arm) Seated median nerve stretch  x10 left UE (relieved tingling)   06/02/2024: Discussion of POC  UBE level 1.0 x6 min  with PT present to discuss status - pt did not tolerate fwd very well today. Reverse was better. MMT Prone T and Y 1# wt x 20 B Prone Y to W to Y x 5 Seated ulnar nerve glide x 10 (improves with reps) Seated median nerve stretch x 10 L Standing rows with green tband 2x10 Standing horizontal ABD green  x 20 Standing shoulder extension green band x 20      PATIENT EDUCATION:  Education details: ongoing PT POC, HEP review, and HEP update   Person educated:  Patient Education method: Explanation, Demonstration, and Handouts Education comprehension: verbalized understanding and returned demonstration  HOME EXERCISE PROGRAM: Access Code: [email or web address] URL: [email or web address] Date: 06/02/2024 Prepared by: Mliss  Exercises - Seated Upper Trapezius Stretch  - 1 x daily - 7 x weekly - 2 reps - 20 sec hold - Seated Levator Scapulae Stretch  - 1 x daily - 7 x weekly - 2 reps - 20 sec hold - Seated Cervical Retraction  - 1 x daily - 7 x weekly - 2 sets - 10 reps - Mid-Lower Cervical Extension SNAG with Strap  - 1 x daily - 7 x weekly - 2 sets - 10 reps - Shoulder External Rotation and Scapular Retraction with Resistance  - 1 x daily - 7 x weekly - 2 sets - 10 reps - Standing Shoulder Horizontal Abduction with Resistance  - 1 x daily - 7 x weekly - 2 sets - 10 reps - Shoulder Internal Rotation with Resistance  - 1 x daily - 3 x weekly - 2 sets - 10 reps - Shoulder External Rotation with Anchored Resistance  - 1 x daily - 3 x weekly - 2 sets - 10 reps - Standing Shoulder Flexion with Resistance  - 1 x daily - 3 x weekly - 2 sets - 10 reps - Scaption with Resistance  - 1 x daily - 3 x weekly - 2 sets - 10 reps - Standing Single Arm Shoulder Abduction with Resistance  - 1 x daily - 3 x weekly - 2 sets - 10 reps - Standing Median Nerve Glide  - 1 x daily - 7 x weekly - 1 sets - 10 reps - Standing Ulnar Nerve Glide  - 1 x daily - 7 x weekly - 1 sets - 10 reps - Prone Scapular Retraction Y  - 1 x daily - 3 x weekly - 2 sets - 10 reps  ASSESSMENT:  CLINICAL IMPRESSION: Patient presents with no pain today. She reports that she has not played pickleball and has only done her exercises a few times. She plans to play today. She was able to increase her resistance with some exercises. Mild tension with ulnar nerve glides today which improved with reps. Median nerve glide with head tilt still causes tingling in left hand. She reports improvements with  some ADLs including driving, but still feels sx with reading. She continues to demonstrate potential for improvement and would benefit from continued skilled therapy to address impairments.    OBJECTIVE IMPAIRMENTS: decreased strength, increased fascial restrictions, increased muscle spasms, impaired sensation, postural dysfunction, and pain.   ACTIVITY LIMITATIONS: carrying, lifting, and sleeping  PARTICIPATION LIMITATIONS: cleaning, driving, and community activity  PERSONAL FACTORS: Past/current experiences, Time since onset of injury/illness/exacerbation, and 1 comorbidity: HTN are also affecting patient's functional outcome.   REHAB POTENTIAL: Good  CLINICAL DECISION MAKING:  Stable/uncomplicated  EVALUATION COMPLEXITY: Low   GOALS: Goals reviewed with patient? Yes  SHORT TERM GOALS: Target date: 05/28/2024  Patient will be independent with initial HEP. Baseline:  Goal status: Met on 05/19/24  2.  Patient will report at least a 25% improvement in symptoms since starting PT. Baseline:  Goal status: MET 06/02/24    LONG TERM GOALS: Target date: 08/23/24  Patient will be independent with advanced HEP to allow for self progression after discharge. Baseline:  Goal status: Ongoing  2.  Patient will report ability to play pickle-ball without increased pain. Baseline:  Goal status: Ongoing  3.  Patient will increase left grip strength to at least 50# to allow her to open tight jars. Baseline: 45# Goal status: Met on 06/07/2024  4.  Patient will improve Neck Disability Index to no greater than 20% to demonstrate improvements in functional tasks. Baseline: 38% Goal status: Met on 06/07/24  5.  Patient will increase left shoulder strength to at least 4+ to 5-/5 to allow her to perform more advanced tasks at home, such as cleaning and performing yard work. Baseline:  Goal status: MET  6.  Patient able to cook and perform ADLS with normal use and strength of L UE Baseline:  Goal  status: NEW  7.  Patient able to use her left UE for ADLS without pain provocation. Baseline:  Goal status: IN PROGRESS 10/7     PLAN:  PT FREQUENCY: 1-2x/week  PT DURATION: 8 weeks  PLANNED INTERVENTIONS: 97164- PT Re-evaluation, 97750- Physical Performance Testing, 97110-Therapeutic exercises, 97530- Therapeutic activity, 97112- Neuromuscular re-education, 97535- Self Care, [id number]- Manual therapy, [phone]- Canalith repositioning, [id number]- Aquatic Therapy, [id number]- Electrical stimulation (unattended), [phone]- Electrical stimulation (manual), [id number]- Vasopneumatic device, [id number]- Ultrasound, [id number]- Traction (mechanical), [id number]- Ionotophoresis 4mg /ml Dexamethasone, [id number] (1-2 muscles), [id number] (3+ muscles)- Dry Needling, Patient/Family education, Balance training, Taping, Joint mobilization, Joint manipulation, Spinal manipulation, Spinal mobilization, Vestibular training, Cryotherapy, and Moist heat  PLAN FOR NEXT SESSION:  Focus on elbow, wrist and forearm strength, median nerve glide, wrist flexibility, DN as indicated, low trap/mid trap strengthening   Mliss Cummins, PT  07/06/24, 10:16 AM   [facility] [address] [place], [place] [address] Phone # [phone] Fax [phone]

## 2024-07-06 ENCOUNTER — Other Ambulatory Visit: Payer: Self-pay | Admitting: Family Medicine

## 2024-07-06 ENCOUNTER — Other Ambulatory Visit: Payer: Self-pay

## 2024-07-06 ENCOUNTER — Ambulatory Visit: Attending: Family Medicine | Admitting: Physical Therapy

## 2024-07-06 ENCOUNTER — Other Ambulatory Visit (HOSPITAL_COMMUNITY): Payer: Self-pay

## 2024-07-06 ENCOUNTER — Encounter: Payer: Self-pay | Admitting: Physical Therapy

## 2024-07-06 DIAGNOSIS — J069 Acute upper respiratory infection, unspecified: Secondary | ICD-10-CM

## 2024-07-06 DIAGNOSIS — R293 Abnormal posture: Secondary | ICD-10-CM | POA: Diagnosis not present

## 2024-07-06 DIAGNOSIS — R252 Cramp and spasm: Secondary | ICD-10-CM | POA: Diagnosis not present

## 2024-07-06 DIAGNOSIS — M5412 Radiculopathy, cervical region: Secondary | ICD-10-CM | POA: Insufficient documentation

## 2024-07-06 DIAGNOSIS — M6281 Muscle weakness (generalized): Secondary | ICD-10-CM | POA: Diagnosis not present

## 2024-07-06 MED ORDER — IPRATROPIUM BROMIDE 0.03 % NA SOLN
2.0000 | Freq: Two times a day (BID) | NASAL | 0 refills | Status: AC
  Filled 2024-07-06: qty 30, 30d supply, fill #0

## 2024-07-09 ENCOUNTER — Other Ambulatory Visit (HOSPITAL_COMMUNITY): Payer: Self-pay

## 2024-07-09 ENCOUNTER — Encounter: Admitting: Family Medicine

## 2024-07-09 MED ORDER — FLUZONE 0.5 ML IM SUSY
PREFILLED_SYRINGE | INTRAMUSCULAR | 0 refills | Status: AC
  Filled 2024-07-09: qty 0.5, 1d supply, fill #0

## 2024-07-14 ENCOUNTER — Encounter: Payer: Self-pay | Admitting: Rehabilitative and Restorative Service Providers"

## 2024-07-14 ENCOUNTER — Ambulatory Visit: Admitting: Rehabilitative and Restorative Service Providers"

## 2024-07-14 DIAGNOSIS — M6281 Muscle weakness (generalized): Secondary | ICD-10-CM

## 2024-07-14 DIAGNOSIS — R252 Cramp and spasm: Secondary | ICD-10-CM

## 2024-07-14 DIAGNOSIS — R293 Abnormal posture: Secondary | ICD-10-CM

## 2024-07-14 DIAGNOSIS — M5412 Radiculopathy, cervical region: Secondary | ICD-10-CM

## 2024-07-14 NOTE — Therapy (Signed)
 OUTPATIENT PHYSICAL THERAPY TREATMENT NOTE    Patient Name: Rachael Green MRN: [id number] DOB:Aug 17, 1966, 58 y.o., female Today's Date: 07/14/2024  END OF SESSION:  PT End of Session - 07/14/24 0931     Visit Number 11    Date for Recertification  08/23/24    Authorization Type [employer]    PT Start Time 0930    PT Stop Time 1000    PT Time Calculation (min) 30 min    Activity Tolerance Patient tolerated treatment well    Behavior During Therapy [facility] for tasks assessed/performed               Past Medical History:  Diagnosis Date   Allergy    Anxiety    Depression    Diverticulosis    GERD (gastroesophageal reflux disease)    Hypertension    Thyroid  disease    Past Surgical History:  Procedure Laterality Date   ARTHROSCOPIC REPAIR ACL     left   WISDOM TOOTH EXTRACTION     Patient Active Problem List   Diagnosis Date Noted   Left cervical radiculopathy 03/29/2024   Left knee pain 11/29/2021   Glaucoma 02/07/2021   Hyperglycemia 03/11/2019   Essential hypertension 11/11/2018   Hypothyroidism 11/11/2018   Depression, major, in remission 11/11/2018   Anxiety 11/11/2018   GERD (gastroesophageal reflux disease) 11/11/2018   Dyslipidemia 11/11/2018   Allergic rhinitis 11/11/2018   Hearing loss 11/11/2018   OSA (obstructive sleep apnea) 11/11/2018   Steatosis of liver 01/09/2018   Lipoma of upper arm 05/10/2016   Insomnia 03/17/2012    PCP: Kennyth Worth HERO, MD  REFERRING PROVIDER: Claudene Arthea HERO, DO  REFERRING DIAG: [phone] (ICD-10-CM) - Left cervical radiculopathy  THERAPY DIAG:  Muscle weakness (generalized)  Cramp and spasm  Radiculopathy, cervical region  Abnormal posture  Rationale for Evaluation and Treatment: Rehabilitation  ONSET DATE: severely for June 2025  SUBJECTIVE:                                                                                                                                                                                                          SUBJECTIVE STATEMENT: Pt reports that she has been playing pickle ball and she was having some soreness.  Patient reports that she is having some tightness in her legs.  Patient will be driving to [facility] for 2.5 weeks tomorrow morning.  Hand dominance: Left  PERTINENT HISTORY:  HTN.  Cervical radiculopathy with epidural injection in July 2025.  PAIN:  Are you having pain? Yes: NPRS  scale: 1/10 Pain location: lower body and cervical region Pain description: it hurts Aggravating factors: unknown Relieving factors: sometimes a position change  PRECAUTIONS: None  RED FLAGS: None     WEIGHT BEARING RESTRICTIONS: No  FALLS:  Has patient fallen in last 6 months? No  LIVING ENVIRONMENT: Lives with: lives with their spouse Lives in: House/apartment Stairs: split level home Has following equipment at home: None  OCCUPATION: Retired  PLOF: Independent and Leisure: Pickle-ball  PATIENT GOALS: To return to playing pickle-ball and be able to work out at [employer].  NEXT MD VISIT: Dr Kennyth on 06/24/24  OBJECTIVE:  Note: Objective measures were completed at Evaluation unless otherwise noted.  DIAGNOSTIC FINDINGS:  Cervical MRI on 03/31/2024: IMPRESSION: 1. Cervical spondylosis as outlined within the body of the report. 2. At C5-C6, there is moderate disc degeneration. Disc bulge with endplate spurring and left greater than right uncovertebral hypertrophy. Superimposed small central disc protrusion. Mild spinal canal stenosis. The disc protrusion mildly flattens the ventral aspect of the spinal cord. Bilateral neural foraminal narrowing (moderate right, severe left). 3. At C6-C7, there is moderate disc degeneration. Shallow disc bulge. Superimposed broad-based left center/foraminal disc protrusion. No significant spinal canal stenosis. The disc protrusion contributes to severe left neural foraminal narrowing. 4. No more than mild  spinal canal narrowing, and no significant foraminal narrowing, at the remaining cervical levels. 5. Mild multilevel facet arthropathy.  Cervical Radiograph on 03/29/24: IMPRESSION: No acute displaced fracture or traumatic listhesis of the cervical spine.  PATIENT SURVEYS:  NDI:  NECK DISABILITY INDEX   Score Date: 05/03/2024 06/07/24  Pain intensity 2 = The pain is moderate at the moment 0  2. Personal care (washing, dressing, etc.) 1 =  I can look after myself normally but it causes extra pain 0  3. Lifting 2 = Pain prevents me lifting heavy weights off the floor, but I can manage if they are  conveniently placed, for example on a table 0  4. Reading 3 = I can't read as much as I want because of moderate pain in my neck 0  5. Headaches 0 = I have no headaches at all 1  6. Concentration 1 =  I can concentrate fully when I want to with slight difficulty  0  7. Work 2 = I can do most of my usual work, but no more 0  8. Driving 3 = I can't drive my car as long as I want because of moderate pain in my neck 0  9. Sleeping 2 = My sleep is mildly disturbed (1-2 hrs sleepless) 0  10. Recreation 3 = I am able to engage in a few of my usual recreation activities because of pain in   my neck 1  Total 19/50 = 38% 2/50 = 4.0%   Minimum Detectable Change (90% confidence): 5 points or 10% points  COGNITION: Overall cognitive status: Within functional limits for tasks assessed  SENSATION: Pt reports numbness and tingling down left arm  POSTURE: rounded shoulders and forward head  PALPATION: Patient with increased muscle tension/spasms noted along bilateral cervical paraspinals, upper traps, and rhomboids   CERVICAL ROM:   Active ROM A/ROM (deg) eval  Flexion 44  Extension 40  Right lateral flexion 40  Left lateral flexion 35  Right rotation 45  Left rotation 45   (Blank rows = not tested)  UPPER EXTREMITY ROM:  [facility]  UPPER EXTREMITY MMT:  MMT Right eval Left eval Right 06/02/24 Left   06/02/24 L  06/23/24  Shoulder flexion 5 5-  5   Shoulder extension       Shoulder abduction 5 4+  5   Shoulder adduction       Shoulder internal rotation 4+ 4-  5   Shoulder external rotation 4+ 4-  5   Middle trapezius   5 4-   Lower trapezius   4 4-   Grip strength 49 45   47 avg 50/50/40   (Blank rows = not tested)  06/07/2024: Right: 50lbs Left:  50 lbs  06/23/24 L wrist flex and ext 5/5 and pro/sup 5/5, thumb flex 3+/5  07/14/2024: Right: 55 lbs Left:  50 lbs   CERVICAL SPECIAL TESTS:  Distraction test: patient reports less pain  FUNCTIONAL TESTS:  Eval:  5 times sit to stand: 6.28 sec  TREATMENT DATE:   07/14/2024: UBE level 1.5 x2 min each direction with PT present to discuss status Standing hamstring stretch at stairs 2x20 sec bilat Standing hip flexor stretch with lateral overhead reach x10 bilat Seated 3 way blue pball rollout x10 each direction Grip strength:  right- 55 lbs, left 50 lbs Seated ulnar nerve stretch (hands in prayer position moving side to side) x10 Education about stretching during her trip, especially with the long car rides   10/7//2025: UBE L3 x 2 min fwd, L2.5 bwd x 2 min PT present to assess status Seated wrist ext and flexion 1# x 30 ea  Seated ulnar deviation x 10 1#, then 2# x 20 Seated radial deviation 2# x30 Seated forearm pro/sup 2# x 30 some popping in elbow Seated wrist flexor stretch 2x30 sec ea (elbow straight) Seated flexor digitorum stretch (fingers 3 and 4) reviewed Seated wrist extensor elbow straight stretch 2x30 sec Standing biceps curls 5# 3x 10, hammer curls 3 x 10  Standing deltoid fly bent elbow 3# x 20 challenging Triceps ext L 3# x 20 Standing upper cuts 3# 2x10 (simulating pickleball serve) Ulnar nerve tension - did B x 10  Standing median nerve glide - with R lateral neck flexion today x 10 left UE - some tingling reported  06/23/2024: Elbow, wrist and forearm strength assessed Seated wrist ext and flexion  1# x 30 ea (2 and 3# too heavy) Seated forearm pro/sup 1# x 30 Seated wrist flexor stretch 2x30 sec ea (elbow straight) Seated flexor digitorum stretch (fingers 3 and 4)  Seated wrist extensor stretch 2x30 sec Standing biceps curls 5# x 10, hammer curls x 10 (harder and feels tightness in her forearm) Standing deltoid fly bent elbow 3# x 20 Triceps ext L 3# x 20 Ulnar nerve tension - negative today Standing median nerve glide - added R lateral neck flexion today x 10 left UE     PATIENT EDUCATION:  Education details: ongoing PT POC, HEP review, and HEP update   Person educated: Patient Education method: Explanation, Demonstration, and Handouts Education comprehension: verbalized understanding and returned demonstration  HOME EXERCISE PROGRAM: Access Code: [email or web address] URL: [email or web address] Date: 07/14/2024 Prepared by: Jarrell Jamaiya Tunnell  Exercises - Seated Upper Trapezius Stretch  - 1 x daily - 7 x weekly - 2 reps - 20 sec hold - Seated Levator Scapulae Stretch  - 1 x daily - 7 x weekly - 2 reps - 20 sec hold - Seated Cervical Retraction  - 1 x daily - 7 x weekly - 2 sets - 10 reps - Mid-Lower Cervical Extension SNAG with Strap  - 1 x daily - 7 x weekly - 2 sets -  10 reps - Shoulder External Rotation and Scapular Retraction with Resistance  - 1 x daily - 7 x weekly - 2 sets - 10 reps - Standing Shoulder Horizontal Abduction with Resistance  - 1 x daily - 7 x weekly - 2 sets - 10 reps - Shoulder Internal Rotation with Resistance  - 1 x daily - 3 x weekly - 2 sets - 10 reps - Shoulder External Rotation with Anchored Resistance  - 1 x daily - 3 x weekly - 2 sets - 10 reps - Standing Shoulder Flexion with Resistance  - 1 x daily - 3 x weekly - 2 sets - 10 reps - Scaption with Resistance  - 1 x daily - 3 x weekly - 2 sets - 10 reps - Standing Single Arm Shoulder Abduction with Resistance  - 1 x daily - 3 x weekly - 2 sets - 10 reps - Standing Median Nerve Glide  - 1 x  daily - 7 x weekly - 1 sets - 10 reps - Standing Ulnar Nerve Glide  - 1 x daily - 7 x weekly - 1 sets - 10 reps - Prone Scapular Retraction Y  - 1 x daily - 3 x weekly - 2 sets - 10 reps - Wrist Extension with Dumbbell  - 1 x daily - 3 x weekly - 2 sets - 10 reps - Wrist Flexion with Dumbbell  - 1 x daily - 3 x weekly - 2 sets - 10 reps - Forearm Pronation with Dumbbell  - 1 x daily - 3 x weekly - 2 sets - 10 reps - Seated Wrist Radial Deviation with Dumbbell  - 1 x daily - 3 x weekly - 2 sets - 10 reps - Standing Wrist Extension Stretch  - 2 x daily - 7 x weekly - 1 sets - 3 reps - 30 sec hold - Standing Wrist Flexion Stretch  - 2 x daily - 7 x weekly - 1 sets - 3 reps - 30 sec  hold - Seated Hamstring Stretch  - 1 x daily - 7 x weekly - 2 reps - 20 sec hold - Seated Piriformis Stretch with Trunk Bend  - 1 x daily - 7 x weekly - 2 reps - 20 sec hold - Half Kneeling Hip Flexor Stretch with Sidebend  - 1 x daily - 7 x weekly - 10 reps  ASSESSMENT:  CLINICAL IMPRESSION:  Ms Benner presents to skilled PT reporting that she is having some overall body soreness and feeling tightness all over.  Patient states that she has to leave early today secondary to the errands she needs to complete before going out of town tomorrow.  Patient able to perform general body stretches to assist with her decreased tightness.  Patient educated on the importance of performing the stretches while out of town, especially if she is noticing that she is tensing up due to the drive.  Patient did report that she felt looser after the session and she was provided with handouts to assist her with HEP.  Patient states that she may want dry needling again after the 2.5 week hiatus from PT.    OBJECTIVE IMPAIRMENTS: decreased strength, increased fascial restrictions, increased muscle spasms, impaired sensation, postural dysfunction, and pain.   ACTIVITY LIMITATIONS: carrying, lifting, and sleeping  PARTICIPATION LIMITATIONS:  cleaning, driving, and community activity  PERSONAL FACTORS: Past/current experiences, Time since onset of injury/illness/exacerbation, and 1 comorbidity: HTN are also affecting patient's functional outcome.   REHAB POTENTIAL:  Good  CLINICAL DECISION MAKING: Stable/uncomplicated  EVALUATION COMPLEXITY: Low   GOALS: Goals reviewed with patient? Yes  SHORT TERM GOALS: Target date: 05/28/2024  Patient will be independent with initial HEP. Baseline:  Goal status: Met on 05/19/24  2.  Patient will report at least a 25% improvement in symptoms since starting PT. Baseline:  Goal status: MET 06/02/24    LONG TERM GOALS: Target date: 08/23/24  Patient will be independent with advanced HEP to allow for self progression after discharge. Baseline:  Goal status: Ongoing  2.  Patient will report ability to play pickle-ball without increased pain. Baseline:  Goal status: Ongoing  3.  Patient will increase left grip strength to at least 50# to allow her to open tight jars. Baseline: 45# Goal status: Met on 06/07/2024  4.  Patient will improve Neck Disability Index to no greater than 20% to demonstrate improvements in functional tasks. Baseline: 38% Goal status: Met on 06/07/24  5.  Patient will increase left shoulder strength to at least 4+ to 5-/5 to allow her to perform more advanced tasks at home, such as cleaning and performing yard work. Baseline:  Goal status: MET  6.  Patient able to cook and perform ADLS with normal use and strength of L UE Baseline:  Goal status: NEW  7.  Patient able to use her left UE for ADLS without pain provocation. Baseline:  Goal status: IN PROGRESS 10/7     PLAN:  PT FREQUENCY: 1-2x/week  PT DURATION: 8 weeks  PLANNED INTERVENTIONS: 97164- PT Re-evaluation, 97750- Physical Performance Testing, 97110-Therapeutic exercises, 97530- Therapeutic activity, 97112- Neuromuscular re-education, 97535- Self Care, 02859- Manual therapy, [phone]- Canalith  repositioning, [id number]- Aquatic Therapy, [phone]- Electrical stimulation (unattended), [phone]- Electrical stimulation (manual), [id number]- Vasopneumatic device, [id number]- Ultrasound, [id number]- Traction (mechanical), [id number]- Ionotophoresis 4mg /ml Dexamethasone, 79439 (1-2 muscles), 20561 (3+ muscles)- Dry Needling, Patient/Family education, Balance training, Taping, Joint mobilization, Joint manipulation, Spinal manipulation, Spinal mobilization, Vestibular training, Cryotherapy, and Moist heat  PLAN FOR NEXT SESSION:  Focus on elbow, wrist and forearm strength, median nerve glide, wrist flexibility, DN as indicated, low trap/mid trap strengthening    Jarrell Laming, PT, DPT 07/14/24, 10:10 AM  [facility] [address], [address] [place], [place] [address] Phone # [phone] Fax [phone]

## 2024-08-13 ENCOUNTER — Encounter: Admitting: Physical Therapy

## 2024-08-20 ENCOUNTER — Encounter: Admitting: Physical Therapy

## 2024-08-23 ENCOUNTER — Encounter: Admitting: Physical Therapy

## 2024-08-24 ENCOUNTER — Other Ambulatory Visit (HOSPITAL_COMMUNITY): Payer: Self-pay

## 2024-08-24 ENCOUNTER — Other Ambulatory Visit: Payer: Self-pay

## 2024-08-24 MED ORDER — LATANOPROST 0.005 % OP SOLN
1.0000 [drp] | Freq: Every evening | OPHTHALMIC | 3 refills | Status: AC
  Filled 2024-08-24 – 2024-09-06 (×3): qty 7.5, 75d supply, fill #0

## 2024-08-25 ENCOUNTER — Other Ambulatory Visit (HOSPITAL_COMMUNITY): Payer: Self-pay

## 2024-09-02 ENCOUNTER — Other Ambulatory Visit (HOSPITAL_COMMUNITY): Payer: Self-pay

## 2024-09-06 ENCOUNTER — Other Ambulatory Visit (HOSPITAL_COMMUNITY): Payer: Self-pay

## 2024-09-07 ENCOUNTER — Encounter: Payer: Self-pay | Admitting: Physical Therapy

## 2024-10-07 NOTE — Progress Notes (Signed)
 " [facility] [address] [place] [address] Phone: [phone] Subjective:   Rachael Green, am serving as a scribe for Dr. Arthea Claudene.  I'm seeing this patient by the request  of:  Rachael Worth HERO, MD  CC: left calf pain follow up   YEP:Dlagzrupcz  Rachael Green is a 59 y.o. female coming in with complaint of L calf pain. Last seen in June 2025 for shoulder and neck pain. Patient states that on Monday she was playing pickleball and felt a pop over peroneal. Has knee pain now inferior to the patella. Experiencing weakness and popping in L knee. Hx of hamstring ACL reconstruction and fx tibia.        Past Medical History:  Diagnosis Date   Allergy    Anxiety    Depression    Diverticulosis    GERD (gastroesophageal reflux disease)    Hypertension    Thyroid  disease    Past Surgical History:  Procedure Laterality Date   ARTHROSCOPIC REPAIR ACL     left   WISDOM TOOTH EXTRACTION     Social History   Socioeconomic History   Marital status: Married    Spouse name: Not on file   Number of children: Not on file   Years of education: Not on file   Highest education level: Not on file  Occupational History   Not on file  Tobacco Use   Smoking status: Never   Smokeless tobacco: Never  Vaping Use   Vaping status: Never Used  Substance and Sexual Activity   Alcohol use: Yes    Alcohol/week: 2.0 standard drinks of alcohol    Types: 2 Glasses of wine per week    Comment: occ   Drug use: Never   Sexual activity: Yes    Birth control/protection: None, Post-menopausal  Other Topics Concern   Not on file  Social History Narrative   Not on file   Social Drivers of Health   Tobacco Use: Low Risk (07/14/2024)   Patient History    Smoking Tobacco Use: Never    Smokeless Tobacco Use: Never    Passive Exposure: Not on file  Financial Resource Strain: Not on file  Food Insecurity: Not on file  Transportation Needs: Not on file   Physical Activity: Not on file  Stress: Not on file  Social Connections: Not on file  Depression (PHQ2-9): Low Risk (03/26/2024)   Depression (PHQ2-9)    PHQ-2 Score: 1  Alcohol Screen: Not on file  Housing: Not on file  Utilities: Not on file  Health Literacy: Not on file   Allergies[1] Family History  Problem Relation Age of Onset   Depression Mother    Kidney cancer Mother    Liver cancer Mother    Colon polyps Mother    Irritable bowel syndrome Mother    Leukemia Maternal Uncle    Esophageal cancer Neg Hx    Colon cancer Neg Hx     Current Outpatient Medications (Endocrine & Metabolic):    predniSONE  (DELTASONE ) 50 MG tablet, Take 1 tablet daily for 5 days.  Current Outpatient Medications (Respiratory):    ipratropium (ATROVENT ) 0.03 % nasal spray, Place 2 sprays into both nostrils every 12 (twelve) hours.   promethazine -dextromethorphan (PROMETHAZINE -DM) 6.25-15 MG/5ML syrup, Take 5 mLs by mouth 4 (four) times daily as needed for cough.   pseudoephedrine  (SUDAFED) 60 MG tablet, Take 1 tablet (60 mg total) by mouth every 6 (six) hours as needed for  congestion.  Current Outpatient Medications (Other):    B Complex Vitamins (VITAMIN B COMPLEX PO), Take by mouth.   Brinzolamide -Brimonidine  (SIMBRINZA ) 1-0.2 % SUSP, Instill 1 drop into both eyes three times a day. Use instead of Rhopressa  due to insurance coverage   calcium  citrate (CALCITRATE - DOSED IN MG ELEMENTAL CALCIUM ) 950 (200 Ca) MG tablet, Take 200 mg of elemental calcium  by mouth daily.   cyclobenzaprine  (FLEXERIL ) 10 MG tablet, Take 1 tablet (10 mg total) by mouth 3 (three) times daily as needed for muscle spasms.   gabapentin  (NEURONTIN ) 100 MG capsule, Take 1 capsule (100 mg total) by mouth at bedtime.   hydrOXYzine  (ATARAX ) 10 MG tablet, Take 0.5-1 tablets (5-10 mg total) by mouth at bedtime as needed.   influenza vac split trivalent PF (FLUZONE ) 0.5 ML injection, Inject into the muscle.   latanoprost  (XALATAN )  0.005 % ophthalmic solution, Place 1 drop into both eyes every evening.   Multiple Vitamin (MULTIVITAMIN) capsule, Take 1 capsule by mouth daily.   Netarsudil  Dimesylate (RHOPRESSA ) 0.02 % SOLN, Place 1 drop into both eyes every night at bedtime.   Netarsudil -Latanoprost  (ROCKLATAN ) 0.02-0.005 % SOLN, Place 1 drop into both eyes daily.   Omega-3 Fatty Acids (FISH OIL PO), Take by mouth.   timolol  (TIMOPTIC ) 0.5 % ophthalmic solution, Place 1 drop into both eyes 2 (two) times daily.   VITAMIN D  PO, Take by mouth.   Reviewed prior external information including notes and imaging from  primary care provider As well as notes that were available from care everywhere and other healthcare systems.  Past medical history, social, surgical and family history all reviewed in electronic medical record.  No pertanent information unless stated regarding to the chief complaint.   Review of Systems:  No headache, visual changes, nausea, vomiting, diarrhea, constipation, dizziness, abdominal pain, skin rash, fevers, chills, night sweats, weight loss, swollen lymph nodes, body aches, joint swelling, chest pain, shortness of breath, mood changes. POSITIVE muscle aches  Objective  Blood pressure 128/88, height 5' 1 (1.549 m), weight 197 lb (89.4 kg), last menstrual period 05/23/2018.   General: No apparent distress alert and oriented x3 mood and affect normal, dressed appropriately.  HEENT: Pupils equal, extraocular movements intact  Respiratory: Patient's speak in full sentences and does not appear short of breath  Cardiovascular: No lower extremity edema, non tender, no erythema  Leg exam shows good range of motion noted.  Patient though does have a mild positive patellar grind test noted.  Some tightness noted in the hamstring but nothing severe.  Limited muscular skeletal ultrasound was performed and interpreted by CLAUDENE HUSSAR, M  Hypoechoic changes noted in the patellofemoral joint.  Hyperechoic  change noted that appears to be more of a bone contusion on the inferior aspect of the patella.  No loose bodies noted.  Patient's medial and lateral joint space has moderate narrowing but nothing acute. Impression: Likely subacute patella subluxation with some underlying arthritic changes.    Impression and Recommendations:     The above documentation has been reviewed and is accurate and complete Liberti Appleton M Kayse Puccini, DO       [1] No Known Allergies  "

## 2024-10-08 ENCOUNTER — Ambulatory Visit: Admitting: Family Medicine

## 2024-10-08 ENCOUNTER — Encounter: Payer: Self-pay | Admitting: Family Medicine

## 2024-10-08 ENCOUNTER — Other Ambulatory Visit: Payer: Self-pay

## 2024-10-08 VITALS — BP 128/88 | Ht 61.0 in | Wt 197.0 lb

## 2024-10-08 DIAGNOSIS — S83012A Lateral subluxation of left patella, initial encounter: Secondary | ICD-10-CM | POA: Diagnosis not present

## 2024-10-08 DIAGNOSIS — M79662 Pain in left lower leg: Secondary | ICD-10-CM | POA: Diagnosis not present

## 2024-10-08 NOTE — Patient Instructions (Signed)
 You have 14 days to return or exchange your brace Call [phone], then return the brace to our office Do prescribed exercises at least 3x a week Wear brace daily for 10 days then with exercise until next appointment Ice after activity and before bed See you again in 2 months

## 2024-10-08 NOTE — Assessment & Plan Note (Addendum)
 Mild subluxation, hypoechoic changes in the knee itself of the patellofemoral joint.  Patient does have a contusion on the superior medial aspect of the underside of the patella that is corresponding on ultrasound.  Bracing given, icing regimen, topical anti-inflammatories, discussed oral anti-inflammatories as needed.  Increase activity slowly.  Follow-up again in 6 to 8 weeks patient is traveling to [place] in 2 to 3 months.  If still having pain we will need to consider the possibility of injection.

## 2024-10-11 ENCOUNTER — Telehealth: Payer: Self-pay | Admitting: Family Medicine

## 2024-10-11 NOTE — Telephone Encounter (Signed)
 Patient called and stated she was given a brace at her last appt.. She does not know if the brace is not fitting well but her knee is hurting worse with it on and it is swelling. Please advise.

## 2024-10-12 ENCOUNTER — Other Ambulatory Visit: Payer: Self-pay | Admitting: Family Medicine

## 2024-10-12 ENCOUNTER — Other Ambulatory Visit (HOSPITAL_COMMUNITY): Payer: Self-pay

## 2024-10-12 MED ORDER — PREDNISONE 20 MG PO TABS
40.0000 mg | ORAL_TABLET | Freq: Every day | ORAL | 0 refills | Status: AC
  Filled 2024-10-12: qty 10, 5d supply, fill #0

## 2024-10-14 NOTE — Telephone Encounter (Signed)
 Fitted brace for patient. Tru pull lite size Large.

## 2024-11-25 ENCOUNTER — Ambulatory Visit: Admitting: Family Medicine

## 2024-12-23 ENCOUNTER — Encounter: Admitting: Internal Medicine
